# Patient Record
Sex: Female | Born: 1945 | Race: White | Hispanic: No | Marital: Single | Smoking: Current every day smoker
Health system: Southern US, Community
[De-identification: ages and names within clinical notes are randomized; demographics above are authoritative.]

## PROBLEM LIST (undated history)

## (undated) DIAGNOSIS — I10 Essential (primary) hypertension: Secondary | ICD-10-CM

## (undated) DIAGNOSIS — I509 Heart failure, unspecified: Secondary | ICD-10-CM

## (undated) DIAGNOSIS — F0390 Unspecified dementia without behavioral disturbance: Secondary | ICD-10-CM

## (undated) DIAGNOSIS — E119 Type 2 diabetes mellitus without complications: Secondary | ICD-10-CM

## (undated) DIAGNOSIS — E785 Hyperlipidemia, unspecified: Secondary | ICD-10-CM

## (undated) DIAGNOSIS — J449 Chronic obstructive pulmonary disease, unspecified: Secondary | ICD-10-CM

## (undated) HISTORY — PX: CHOLECYSTECTOMY: SHX55

## (undated) HISTORY — PX: ABDOMINAL HYSTERECTOMY: SHX81

---

## 2004-09-02 ENCOUNTER — Emergency Department: Payer: Self-pay | Admitting: Emergency Medicine

## 2005-01-28 ENCOUNTER — Emergency Department: Payer: Self-pay | Admitting: Emergency Medicine

## 2005-01-28 ENCOUNTER — Other Ambulatory Visit: Payer: Self-pay

## 2007-01-27 ENCOUNTER — Other Ambulatory Visit: Payer: Self-pay

## 2007-01-27 ENCOUNTER — Inpatient Hospital Stay: Payer: Self-pay | Admitting: Internal Medicine

## 2007-05-26 ENCOUNTER — Other Ambulatory Visit: Payer: Self-pay

## 2007-05-26 ENCOUNTER — Inpatient Hospital Stay: Payer: Self-pay | Admitting: *Deleted

## 2008-04-20 IMAGING — CR DG CHEST 1V PORT
1 series · 1 of 1 positions shown · non-contrast
Comparison: none

REASON FOR EXAM: Syncope
COMMENTS:

PROCEDURE:     DXR - DXR PORTABLE CHEST SINGLE VIEW  - January 27, 2007  [DATE]
RESULT:     Comparison is made to a prior exam of 01/28/2005. The lung
fields are clear. No acute changes of the heart, mediastinal or osseous
structures are seen. Monitoring electrodes are present.

[view not recorded]
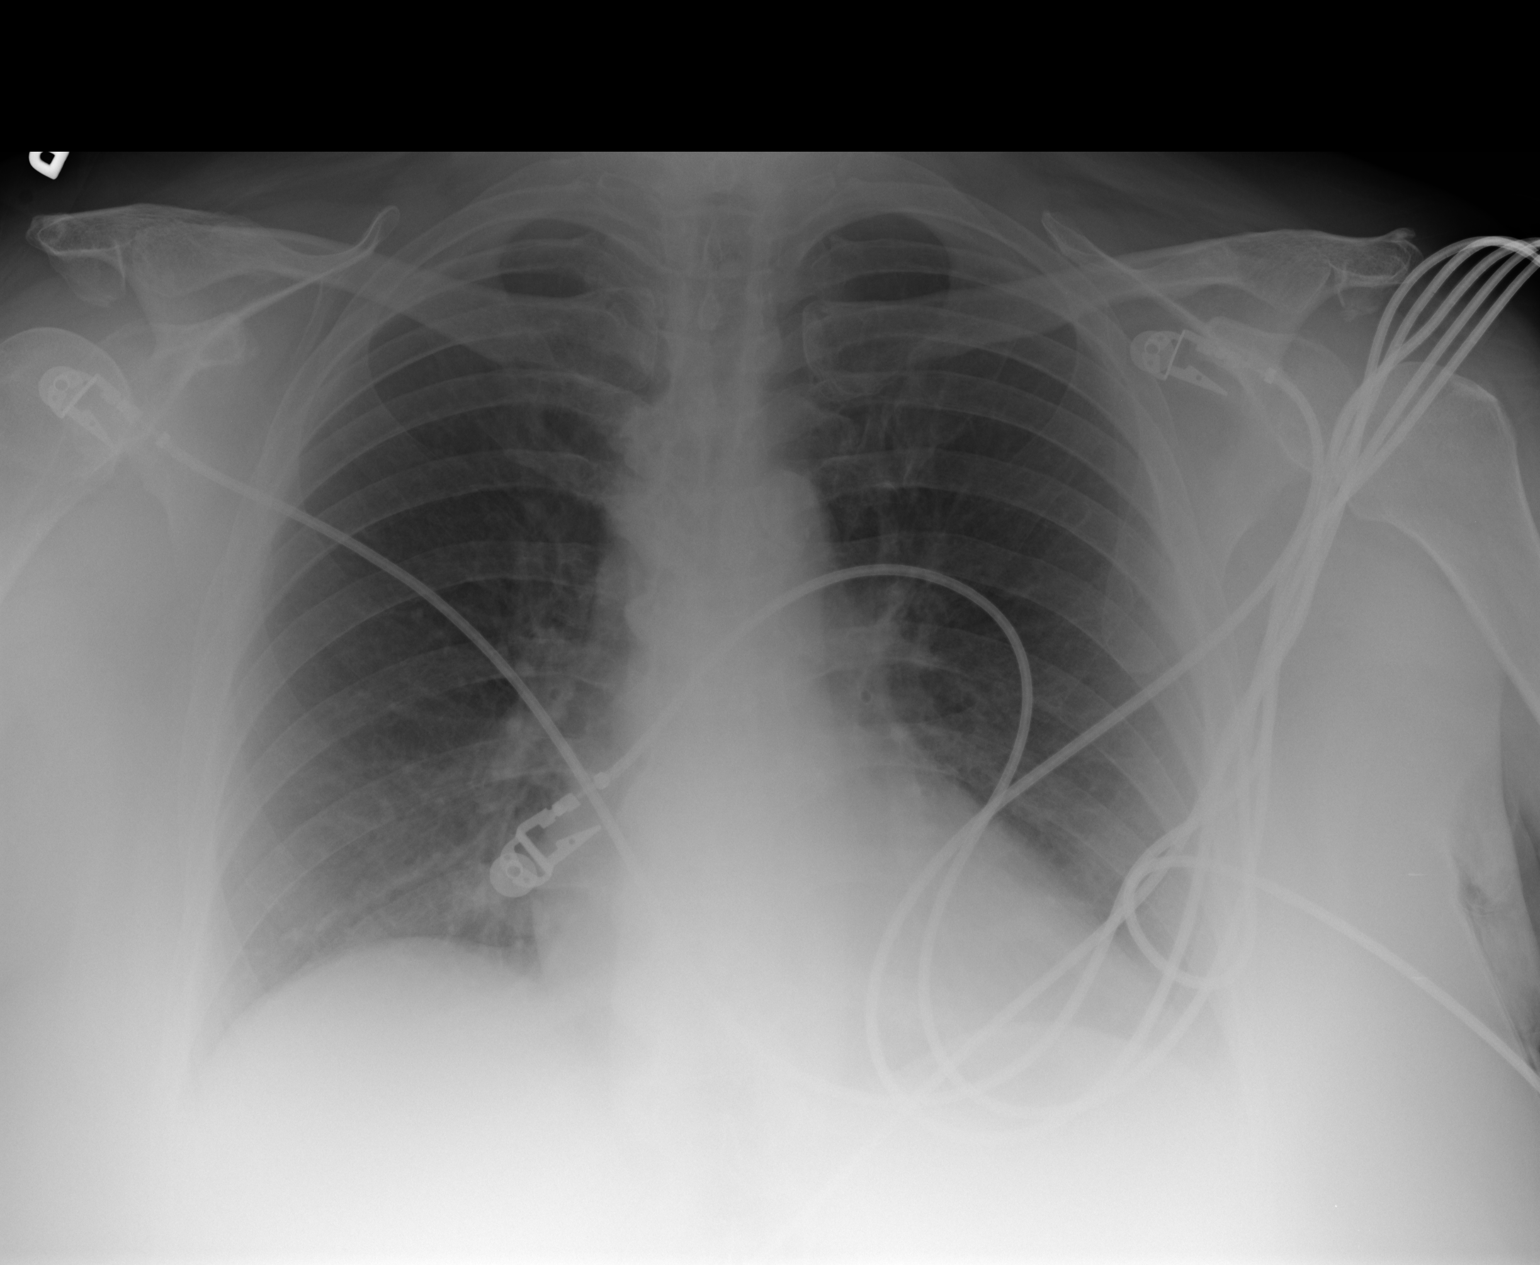

[1 of 1 positions shown; findings below may reference images not displayed]

IMPRESSION: No acute changes are identified.

## 2009-09-16 ENCOUNTER — Emergency Department: Payer: Self-pay | Admitting: Emergency Medicine

## 2010-11-15 ENCOUNTER — Inpatient Hospital Stay: Payer: Self-pay | Admitting: Internal Medicine

## 2013-07-30 ENCOUNTER — Emergency Department: Payer: Self-pay | Admitting: Emergency Medicine

## 2013-07-30 LAB — DRUG SCREEN, URINE

## 2013-07-30 LAB — URINALYSIS, COMPLETE
Bacteria: NONE SEEN
Bilirubin,UR: NEGATIVE
Blood: NEGATIVE
Glucose,UR: NEGATIVE mg/dL (ref 0–75)
Ketone: NEGATIVE
Leukocyte Esterase: NEGATIVE
Nitrite: NEGATIVE
Ph: 6 (ref 4.5–8.0)
Protein: NEGATIVE
RBC,UR: NONE SEEN /HPF (ref 0–5)
Specific Gravity: 1.003 (ref 1.003–1.030)
Squamous Epithelial: 1
WBC UR: <1 /HPF (ref 0–5)

## 2013-07-30 LAB — COMPREHENSIVE METABOLIC PANEL
Albumin: 4 g/dL (ref 3.4–5.0)
Alkaline Phosphatase: 65 U/L (ref 50–136)
Anion Gap: 7 (ref 7–16)
BUN: 21 mg/dL — ABNORMAL HIGH (ref 7–18)
Bilirubin,Total: 0.3 mg/dL (ref 0.2–1.0)
Calcium, Total: 9 mg/dL (ref 8.5–10.1)
Chloride: 103 mmol/L (ref 98–107)
Co2: 21 mmol/L (ref 21–32)
Creatinine: 1.13 mg/dL (ref 0.60–1.30)
EGFR (African American): 59 — ABNORMAL LOW
EGFR (Non-African Amer.): 51 — ABNORMAL LOW
Glucose: 136 mg/dL — ABNORMAL HIGH (ref 65–99)
Osmolality: 268 (ref 275–301)
Potassium: 4.6 mmol/L (ref 3.5–5.1)
SGOT(AST): 14 U/L — ABNORMAL LOW (ref 15–37)
SGPT (ALT): 25 U/L (ref 12–78)
Sodium: 131 mmol/L — ABNORMAL LOW (ref 136–145)
Total Protein: 7.9 g/dL (ref 6.4–8.2)

## 2013-07-30 LAB — CBC
HCT: 40.4 % (ref 35.0–47.0)
HGB: 14.2 g/dL (ref 12.0–16.0)
MCH: 31 pg (ref 26.0–34.0)
MCHC: 35.1 g/dL (ref 32.0–36.0)
MCV: 88 fL (ref 80–100)
Platelet: 230 10*3/uL (ref 150–440)
RBC: 4.59 10*6/uL (ref 3.80–5.20)
RDW: 14.3 % (ref 11.5–14.5)
WBC: 7.6 10*3/uL (ref 3.6–11.0)

## 2013-07-30 LAB — ETHANOL
Ethanol %: <0.003 % (ref 0.000–0.080)
Ethanol: <3 mg/dL

## 2013-07-30 LAB — ACETAMINOPHEN LEVEL: Acetaminophen: <2 ug/mL

## 2013-07-30 LAB — SALICYLATE LEVEL: Salicylates, Serum: 8.3 mg/dL — ABNORMAL HIGH

## 2013-08-02 ENCOUNTER — Emergency Department: Payer: Self-pay | Admitting: Emergency Medicine

## 2013-08-02 LAB — CBC WITH DIFFERENTIAL/PLATELET
Basophil #: 0.1 10*3/uL (ref 0.0–0.1)
Basophil %: 1.1 %
Eosinophil #: 0.2 10*3/uL (ref 0.0–0.7)
Eosinophil %: 1.7 %
HCT: 39.1 % (ref 35.0–47.0)
HGB: 13.7 g/dL (ref 12.0–16.0)
Lymphocyte #: 2.2 10*3/uL (ref 1.0–3.6)
Lymphocyte %: 24.8 %
MCH: 30.6 pg (ref 26.0–34.0)
MCHC: 34.9 g/dL (ref 32.0–36.0)
MCV: 88 fL (ref 80–100)
Monocyte #: 0.4 x10 3/mm (ref 0.2–0.9)
Monocyte %: 4.9 %
Neutrophil #: 6.1 10*3/uL (ref 1.4–6.5)
Neutrophil %: 67.5 %
Platelet: 249 10*3/uL (ref 150–440)
RBC: 4.46 10*6/uL (ref 3.80–5.20)
RDW: 14 % (ref 11.5–14.5)
WBC: 9 10*3/uL (ref 3.6–11.0)

## 2013-08-02 LAB — COMPREHENSIVE METABOLIC PANEL
Albumin: 3.9 g/dL (ref 3.4–5.0)
Alkaline Phosphatase: 48 U/L
Anion Gap: 6 — ABNORMAL LOW (ref 7–16)
BUN: 23 mg/dL — ABNORMAL HIGH (ref 7–18)
Bilirubin,Total: 0.2 mg/dL (ref 0.2–1.0)
Calcium, Total: 8.8 mg/dL (ref 8.5–10.1)
Chloride: 104 mmol/L (ref 98–107)
Co2: 22 mmol/L (ref 21–32)
Creatinine: 1.07 mg/dL (ref 0.60–1.30)
EGFR (African American): >60
EGFR (Non-African Amer.): 54 — ABNORMAL LOW
Glucose: 133 mg/dL — ABNORMAL HIGH (ref 65–99)
Osmolality: 270 (ref 275–301)
Potassium: 4.4 mmol/L (ref 3.5–5.1)
SGOT(AST): 21 U/L (ref 15–37)
SGPT (ALT): 22 U/L (ref 12–78)
Sodium: 132 mmol/L — ABNORMAL LOW (ref 136–145)
Total Protein: 7.5 g/dL (ref 6.4–8.2)

## 2013-08-02 LAB — URINALYSIS, COMPLETE
Bacteria: NONE SEEN
Bilirubin,UR: NEGATIVE
Blood: NEGATIVE
Glucose,UR: NEGATIVE mg/dL (ref 0–75)
Hyaline Cast: 3
Ketone: NEGATIVE
Leukocyte Esterase: NEGATIVE
Nitrite: NEGATIVE
Ph: 5 (ref 4.5–8.0)
Protein: NEGATIVE
RBC,UR: NONE SEEN /HPF (ref 0–5)
Specific Gravity: 1.004 (ref 1.003–1.030)
Squamous Epithelial: <1
WBC UR: <1 /HPF (ref 0–5)

## 2013-08-02 LAB — TROPONIN I: Troponin-I: <0.02 ng/mL

## 2013-08-02 LAB — PRO B NATRIURETIC PEPTIDE: B-Type Natriuretic Peptide: 75 pg/mL (ref 0–125)

## 2013-08-02 LAB — TSH: Thyroid Stimulating Horm: 1.69 u[IU]/mL

## 2013-08-18 ENCOUNTER — Emergency Department: Payer: Self-pay | Admitting: Emergency Medicine

## 2013-08-18 LAB — COMPREHENSIVE METABOLIC PANEL
Albumin: 4.1 g/dL (ref 3.4–5.0)
Alkaline Phosphatase: 50 U/L
Anion Gap: 8 (ref 7–16)
BUN: 32 mg/dL — ABNORMAL HIGH (ref 7–18)
Bilirubin,Total: 0.4 mg/dL (ref 0.2–1.0)
Calcium, Total: 8.8 mg/dL (ref 8.5–10.1)
Chloride: 101 mmol/L (ref 98–107)
Co2: 18 mmol/L — ABNORMAL LOW (ref 21–32)
Creatinine: 1.44 mg/dL — ABNORMAL HIGH (ref 0.60–1.30)
EGFR (African American): 44 — ABNORMAL LOW
EGFR (Non-African Amer.): 38 — ABNORMAL LOW
Glucose: 132 mg/dL — ABNORMAL HIGH (ref 65–99)
Osmolality: 264 (ref 275–301)
Potassium: 4.4 mmol/L (ref 3.5–5.1)
SGOT(AST): 22 U/L (ref 15–37)
SGPT (ALT): 25 U/L (ref 12–78)
Sodium: 127 mmol/L — ABNORMAL LOW (ref 136–145)
Total Protein: 7.8 g/dL (ref 6.4–8.2)

## 2013-08-18 LAB — URINALYSIS, COMPLETE
Bacteria: NONE SEEN
Bilirubin,UR: NEGATIVE
Blood: NEGATIVE
Glucose,UR: NEGATIVE mg/dL (ref 0–75)
Ketone: NEGATIVE
Leukocyte Esterase: NEGATIVE
Nitrite: NEGATIVE
Ph: 5 (ref 4.5–8.0)
Protein: NEGATIVE
RBC,UR: NONE SEEN /HPF (ref 0–5)
Specific Gravity: 1.01 (ref 1.003–1.030)
Squamous Epithelial: 4
WBC UR: <1 /HPF (ref 0–5)

## 2013-08-18 LAB — DRUG SCREEN, URINE

## 2013-08-18 LAB — ACETAMINOPHEN LEVEL: Acetaminophen: <2 ug/mL

## 2013-08-18 LAB — CBC
HCT: 41.4 % (ref 35.0–47.0)
HGB: 14.4 g/dL (ref 12.0–16.0)
MCH: 30.9 pg (ref 26.0–34.0)
MCHC: 34.9 g/dL (ref 32.0–36.0)
MCV: 89 fL (ref 80–100)
Platelet: 206 10*3/uL (ref 150–440)
RBC: 4.68 10*6/uL (ref 3.80–5.20)
RDW: 14.2 % (ref 11.5–14.5)
WBC: 9.2 10*3/uL (ref 3.6–11.0)

## 2013-08-18 LAB — ETHANOL
Ethanol %: <0.003 % (ref 0.000–0.080)
Ethanol: <3 mg/dL

## 2013-08-18 LAB — SALICYLATE LEVEL: Salicylates, Serum: 10.1 mg/dL — ABNORMAL HIGH

## 2013-09-29 ENCOUNTER — Emergency Department: Payer: Self-pay | Admitting: Emergency Medicine

## 2013-09-29 LAB — COMPREHENSIVE METABOLIC PANEL
Albumin: 3.4 g/dL (ref 3.4–5.0)
Alkaline Phosphatase: 56 U/L
Anion Gap: 8 (ref 7–16)
BUN: 12 mg/dL (ref 7–18)
Bilirubin,Total: 0.2 mg/dL (ref 0.2–1.0)
Calcium, Total: 8.5 mg/dL (ref 8.5–10.1)
Chloride: 105 mmol/L (ref 98–107)
Co2: 23 mmol/L (ref 21–32)
Creatinine: 0.89 mg/dL (ref 0.60–1.30)
EGFR (African American): >60
EGFR (Non-African Amer.): >60
Glucose: 147 mg/dL — ABNORMAL HIGH (ref 65–99)
Osmolality: 274 (ref 275–301)
Potassium: 3.8 mmol/L (ref 3.5–5.1)
SGOT(AST): 20 U/L (ref 15–37)
SGPT (ALT): 23 U/L (ref 12–78)
Sodium: 136 mmol/L (ref 136–145)
Total Protein: 6.8 g/dL (ref 6.4–8.2)

## 2013-09-29 LAB — PRO B NATRIURETIC PEPTIDE: B-Type Natriuretic Peptide: 135 pg/mL — ABNORMAL HIGH (ref 0–125)

## 2013-09-29 LAB — CK TOTAL AND CKMB (NOT AT ARMC)
CK, Total: 69 U/L (ref 21–215)
CK-MB: 1.9 ng/mL (ref 0.5–3.6)

## 2013-09-29 LAB — CBC
HCT: 36.2 % (ref 35.0–47.0)
HGB: 12.5 g/dL (ref 12.0–16.0)
MCH: 31 pg (ref 26.0–34.0)
MCHC: 34.5 g/dL (ref 32.0–36.0)
MCV: 90 fL (ref 80–100)
Platelet: 243 10*3/uL (ref 150–440)
RBC: 4.03 10*6/uL (ref 3.80–5.20)
RDW: 14.7 % — ABNORMAL HIGH (ref 11.5–14.5)
WBC: 9.1 10*3/uL (ref 3.6–11.0)

## 2013-09-29 LAB — TROPONIN I: Troponin-I: <0.02 ng/mL

## 2013-10-09 LAB — CBC
HCT: 39.6 % (ref 35.0–47.0)
HGB: 13.7 g/dL (ref 12.0–16.0)
MCH: 30.8 pg (ref 26.0–34.0)
MCHC: 34.5 g/dL (ref 32.0–36.0)
MCV: 89 fL (ref 80–100)
Platelet: 239 10*3/uL (ref 150–440)
RBC: 4.44 10*6/uL (ref 3.80–5.20)
RDW: 14.2 % (ref 11.5–14.5)
WBC: 10.5 10*3/uL (ref 3.6–11.0)

## 2013-10-09 LAB — BASIC METABOLIC PANEL
Anion Gap: 9 (ref 7–16)
BUN: 14 mg/dL (ref 7–18)
Calcium, Total: 9 mg/dL (ref 8.5–10.1)
Chloride: 95 mmol/L — ABNORMAL LOW (ref 98–107)
Co2: 23 mmol/L (ref 21–32)
Creatinine: 1.29 mg/dL (ref 0.60–1.30)
EGFR (African American): 50 — ABNORMAL LOW
EGFR (Non-African Amer.): 43 — ABNORMAL LOW
Glucose: 144 mg/dL — ABNORMAL HIGH (ref 65–99)
Osmolality: 258 (ref 275–301)
Potassium: 4.3 mmol/L (ref 3.5–5.1)
Sodium: 127 mmol/L — ABNORMAL LOW (ref 136–145)

## 2013-10-09 LAB — TROPONIN I: Troponin-I: <0.02 ng/mL

## 2013-10-10 DIAGNOSIS — I369 Nonrheumatic tricuspid valve disorder, unspecified: Secondary | ICD-10-CM

## 2013-10-10 LAB — BASIC METABOLIC PANEL
Anion Gap: 8 (ref 7–16)
BUN: 17 mg/dL (ref 7–18)
Calcium, Total: 8.3 mg/dL — ABNORMAL LOW (ref 8.5–10.1)
Chloride: 98 mmol/L (ref 98–107)
Co2: 23 mmol/L (ref 21–32)
Creatinine: 1.41 mg/dL — ABNORMAL HIGH (ref 0.60–1.30)
EGFR (African American): 45 — ABNORMAL LOW
EGFR (Non-African Amer.): 39 — ABNORMAL LOW
Glucose: 129 mg/dL — ABNORMAL HIGH (ref 65–99)
Osmolality: 262 (ref 275–301)
Potassium: 3.9 mmol/L (ref 3.5–5.1)
Sodium: 129 mmol/L — ABNORMAL LOW (ref 136–145)

## 2013-10-10 LAB — URINALYSIS, COMPLETE
Bilirubin,UR: NEGATIVE
Blood: NEGATIVE
Glucose,UR: NEGATIVE mg/dL (ref 0–75)
Granular Cast: 4
Hyaline Cast: 9
Ketone: NEGATIVE
Nitrite: NEGATIVE
Ph: 5 (ref 4.5–8.0)
Protein: NEGATIVE
RBC,UR: 1 /HPF (ref 0–5)
Specific Gravity: 1.013 (ref 1.003–1.030)
Squamous Epithelial: 5
WBC UR: 4 /HPF (ref 0–5)

## 2013-10-10 LAB — TROPONIN I
Troponin-I: <0.02 ng/mL
Troponin-I: <0.02 ng/mL

## 2013-10-10 LAB — CK-MB
CK-MB: 2.8 ng/mL (ref 0.5–3.6)
CK-MB: 3 ng/mL (ref 0.5–3.6)
CK-MB: 3.1 ng/mL (ref 0.5–3.6)

## 2013-10-10 LAB — LIPID PANEL
Cholesterol: 118 mg/dL (ref 0–200)
HDL Cholesterol: 36 mg/dL — ABNORMAL LOW (ref 40–60)
Ldl Cholesterol, Calc: 47 mg/dL (ref 0–100)
Triglycerides: 173 mg/dL (ref 0–200)
VLDL Cholesterol, Calc: 35 mg/dL (ref 5–40)

## 2013-10-11 ENCOUNTER — Inpatient Hospital Stay: Payer: Self-pay | Admitting: Internal Medicine

## 2013-10-11 LAB — BASIC METABOLIC PANEL
Anion Gap: 4 — ABNORMAL LOW (ref 7–16)
BUN: 18 mg/dL (ref 7–18)
Calcium, Total: 9.3 mg/dL (ref 8.5–10.1)
Chloride: 93 mmol/L — ABNORMAL LOW (ref 98–107)
Co2: 25 mmol/L (ref 21–32)
Creatinine: 0.99 mg/dL (ref 0.60–1.30)
EGFR (African American): >60
EGFR (Non-African Amer.): 59 — ABNORMAL LOW
Glucose: 162 mg/dL — ABNORMAL HIGH (ref 65–99)
Osmolality: 251 (ref 275–301)
Potassium: 4.4 mmol/L (ref 3.5–5.1)
Sodium: 122 mmol/L — ABNORMAL LOW (ref 136–145)

## 2013-10-12 ENCOUNTER — Ambulatory Visit: Payer: Self-pay | Admitting: Oncology

## 2013-10-12 LAB — BASIC METABOLIC PANEL
Anion Gap: 6 — ABNORMAL LOW (ref 7–16)
BUN: 20 mg/dL — ABNORMAL HIGH (ref 7–18)
Calcium, Total: 9.4 mg/dL (ref 8.5–10.1)
Chloride: 95 mmol/L — ABNORMAL LOW (ref 98–107)
Co2: 22 mmol/L (ref 21–32)
Creatinine: 0.94 mg/dL (ref 0.60–1.30)
EGFR (African American): >60
EGFR (Non-African Amer.): >60
Glucose: 154 mg/dL — ABNORMAL HIGH (ref 65–99)
Osmolality: 253 (ref 275–301)
Potassium: 4.3 mmol/L (ref 3.5–5.1)
Sodium: 123 mmol/L — ABNORMAL LOW (ref 136–145)

## 2013-10-12 LAB — URIC ACID: Uric Acid: 3.5 mg/dL (ref 2.6–6.0)

## 2013-10-12 LAB — CHLORIDE, URINE, RANDOM: Chloride, Urine Random: 13 mmol/L — ABNORMAL LOW (ref 55–125)

## 2013-10-12 LAB — OSMOLALITY: Osmolality: 133 mOsm/kg — CL (ref 280–301)

## 2013-10-12 LAB — OSMOLALITY, URINE: Osmolality: 133 mOsm/kg

## 2013-10-12 LAB — POTASSIUM, URINE RANDOM: Potassium, Urine Random: 6 mmol/L — ABNORMAL LOW (ref 55–125)

## 2013-10-12 LAB — SODIUM, URINE, RANDOM: Sodium, Urine Random: 22 mmol/L (ref 20–110)

## 2013-10-13 LAB — BASIC METABOLIC PANEL
Anion Gap: 4 — ABNORMAL LOW (ref 7–16)
BUN: 24 mg/dL — ABNORMAL HIGH (ref 7–18)
Calcium, Total: 9.4 mg/dL (ref 8.5–10.1)
Chloride: 102 mmol/L (ref 98–107)
Co2: 25 mmol/L (ref 21–32)
Creatinine: 1.05 mg/dL (ref 0.60–1.30)
EGFR (African American): >60
EGFR (Non-African Amer.): 55 — ABNORMAL LOW
Glucose: 126 mg/dL — ABNORMAL HIGH (ref 65–99)
Osmolality: 268 (ref 275–301)
Potassium: 4.3 mmol/L (ref 3.5–5.1)
Sodium: 131 mmol/L — ABNORMAL LOW (ref 136–145)

## 2013-10-13 LAB — PLATELET COUNT: Platelet: 242 10*3/uL (ref 150–440)

## 2013-11-07 ENCOUNTER — Emergency Department: Payer: Self-pay | Admitting: Emergency Medicine

## 2013-11-07 LAB — CBC
HCT: 41 % (ref 35.0–47.0)
HGB: 13.4 g/dL (ref 12.0–16.0)
MCH: 29.8 pg (ref 26.0–34.0)
MCHC: 32.7 g/dL (ref 32.0–36.0)
MCV: 91 fL (ref 80–100)
Platelet: 312 10*3/uL (ref 150–440)
RBC: 4.5 10*6/uL (ref 3.80–5.20)
RDW: 14 % (ref 11.5–14.5)
WBC: 11.8 10*3/uL — ABNORMAL HIGH (ref 3.6–11.0)

## 2013-11-07 LAB — COMPREHENSIVE METABOLIC PANEL
Albumin: 3.9 g/dL (ref 3.4–5.0)
Alkaline Phosphatase: 60 U/L
Anion Gap: 7 (ref 7–16)
BUN: 12 mg/dL (ref 7–18)
Bilirubin,Total: 0.3 mg/dL (ref 0.2–1.0)
Calcium, Total: 9 mg/dL (ref 8.5–10.1)
Chloride: 106 mmol/L (ref 98–107)
Co2: 24 mmol/L (ref 21–32)
Creatinine: 1.21 mg/dL (ref 0.60–1.30)
EGFR (African American): 54 — ABNORMAL LOW
EGFR (Non-African Amer.): 47 — ABNORMAL LOW
Glucose: 136 mg/dL — ABNORMAL HIGH (ref 65–99)
Osmolality: 276 (ref 275–301)
Potassium: 3.9 mmol/L (ref 3.5–5.1)
SGOT(AST): 14 U/L — ABNORMAL LOW (ref 15–37)
SGPT (ALT): 31 U/L (ref 12–78)
Sodium: 137 mmol/L (ref 136–145)
Total Protein: 7.7 g/dL (ref 6.4–8.2)

## 2013-11-07 LAB — ETHANOL
Ethanol %: <0.003 % (ref 0.000–0.080)
Ethanol: <3 mg/dL

## 2013-11-07 LAB — DRUG SCREEN, URINE
Amphetamines, Ur Screen: NEGATIVE (ref ?–1000)
Barbiturates, Ur Screen: NEGATIVE (ref ?–200)
Benzodiazepine, Ur Scrn: NEGATIVE (ref ?–200)
Cannabinoid 50 Ng, Ur ~~LOC~~: NEGATIVE (ref ?–50)
Cocaine Metabolite,Ur ~~LOC~~: NEGATIVE (ref ?–300)
MDMA (Ecstasy)Ur Screen: POSITIVE (ref ?–500)
Methadone, Ur Screen: NEGATIVE (ref ?–300)
Opiate, Ur Screen: NEGATIVE (ref ?–300)
Phencyclidine (PCP) Ur S: NEGATIVE (ref ?–25)
Tricyclic, Ur Screen: NEGATIVE (ref ?–1000)

## 2013-11-07 LAB — ACETAMINOPHEN LEVEL: Acetaminophen: <2 ug/mL

## 2013-11-07 LAB — URINALYSIS, COMPLETE
Bacteria: NONE SEEN
Bilirubin,UR: NEGATIVE
Blood: NEGATIVE
Glucose,UR: NEGATIVE mg/dL (ref 0–75)
Hyaline Cast: 4
Ketone: NEGATIVE
Leukocyte Esterase: NEGATIVE
Nitrite: NEGATIVE
Ph: 5 (ref 4.5–8.0)
Protein: 30
RBC,UR: <1 /HPF (ref 0–5)
Specific Gravity: 1.011 (ref 1.003–1.030)
Squamous Epithelial: 1
WBC UR: 1 /HPF (ref 0–5)

## 2013-11-07 LAB — SALICYLATE LEVEL: Salicylates, Serum: 4.8 mg/dL — ABNORMAL HIGH

## 2013-11-08 ENCOUNTER — Ambulatory Visit: Payer: Self-pay | Admitting: Oncology

## 2014-01-05 ENCOUNTER — Emergency Department: Payer: Self-pay | Admitting: Emergency Medicine

## 2014-01-05 LAB — URINALYSIS, COMPLETE
Bilirubin,UR: NEGATIVE
Glucose,UR: NEGATIVE mg/dL (ref 0–75)
Ketone: NEGATIVE
Nitrite: NEGATIVE
Ph: 6 (ref 4.5–8.0)
Protein: NEGATIVE
RBC,UR: 1 /HPF (ref 0–5)
Specific Gravity: 1.003 (ref 1.003–1.030)
Squamous Epithelial: 1
WBC UR: 12 /HPF (ref 0–5)

## 2014-01-05 LAB — COMPREHENSIVE METABOLIC PANEL
Albumin: 4 g/dL (ref 3.4–5.0)
Alkaline Phosphatase: 62 U/L
Anion Gap: 8 (ref 7–16)
BUN: 9 mg/dL (ref 7–18)
Bilirubin,Total: 0.3 mg/dL (ref 0.2–1.0)
Calcium, Total: 9.3 mg/dL (ref 8.5–10.1)
Chloride: 106 mmol/L (ref 98–107)
Co2: 24 mmol/L (ref 21–32)
Creatinine: 0.86 mg/dL (ref 0.60–1.30)
EGFR (African American): >60
EGFR (Non-African Amer.): >60
Glucose: 135 mg/dL — ABNORMAL HIGH (ref 65–99)
Osmolality: 276 (ref 275–301)
Potassium: 4 mmol/L (ref 3.5–5.1)
SGOT(AST): 19 U/L (ref 15–37)
SGPT (ALT): 19 U/L (ref 12–78)
Sodium: 138 mmol/L (ref 136–145)
Total Protein: 8.1 g/dL (ref 6.4–8.2)

## 2014-01-05 LAB — CBC
HCT: 45.3 % (ref 35.0–47.0)
HGB: 15.2 g/dL (ref 12.0–16.0)
MCH: 29.5 pg (ref 26.0–34.0)
MCHC: 33.5 g/dL (ref 32.0–36.0)
MCV: 88 fL (ref 80–100)
Platelet: 251 10*3/uL (ref 150–440)
RBC: 5.15 10*6/uL (ref 3.80–5.20)
RDW: 14.2 % (ref 11.5–14.5)
WBC: 12.2 10*3/uL — ABNORMAL HIGH (ref 3.6–11.0)

## 2014-01-05 LAB — DRUG SCREEN, URINE

## 2014-01-05 LAB — ACETAMINOPHEN LEVEL: Acetaminophen: <2 ug/mL

## 2014-01-05 LAB — SALICYLATE LEVEL: Salicylates, Serum: 6.1 mg/dL — ABNORMAL HIGH

## 2014-01-05 LAB — ETHANOL
Ethanol %: <0.003 % (ref 0.000–0.080)
Ethanol: <3 mg/dL

## 2014-01-07 LAB — URINE CULTURE

## 2014-05-28 ENCOUNTER — Emergency Department: Payer: Self-pay | Admitting: Emergency Medicine

## 2014-05-28 LAB — CBC
HCT: 40.2 % (ref 35.0–47.0)
HGB: 12.5 g/dL (ref 12.0–16.0)
MCH: 24.9 pg — ABNORMAL LOW (ref 26.0–34.0)
MCHC: 31.1 g/dL — ABNORMAL LOW (ref 32.0–36.0)
MCV: 80 fL (ref 80–100)
Platelet: 209 10*3/uL (ref 150–440)
RBC: 5.02 10*6/uL (ref 3.80–5.20)
RDW: 17.2 % — ABNORMAL HIGH (ref 11.5–14.5)
WBC: 6.7 10*3/uL (ref 3.6–11.0)

## 2014-05-28 LAB — BASIC METABOLIC PANEL
Anion Gap: 5 — ABNORMAL LOW (ref 7–16)
BUN: 11 mg/dL (ref 7–18)
Calcium, Total: 8.9 mg/dL (ref 8.5–10.1)
Chloride: 104 mmol/L (ref 98–107)
Co2: 28 mmol/L (ref 21–32)
Creatinine: 1.01 mg/dL (ref 0.60–1.30)
EGFR (African American): >60
EGFR (Non-African Amer.): 58 — ABNORMAL LOW
Glucose: 106 mg/dL — ABNORMAL HIGH (ref 65–99)
Osmolality: 274 (ref 275–301)
Potassium: 3.7 mmol/L (ref 3.5–5.1)
Sodium: 137 mmol/L (ref 136–145)

## 2014-05-28 LAB — CK TOTAL AND CKMB (NOT AT ARMC)
CK, Total: 69 U/L
CK-MB: 2.4 ng/mL (ref 0.5–3.6)

## 2014-05-28 LAB — TROPONIN I: Troponin-I: <0.02 ng/mL

## 2014-05-28 LAB — PRO B NATRIURETIC PEPTIDE: B-Type Natriuretic Peptide: 1349 pg/mL — ABNORMAL HIGH (ref 0–125)

## 2014-07-19 ENCOUNTER — Emergency Department: Payer: Self-pay | Admitting: Internal Medicine

## 2014-10-25 IMAGING — CR DG CHEST 1V PORT
1 series · 1 of 1 positions shown · non-contrast
Comparison: 11/15/2010

CLINICAL DATA: History of pneumonia

EXAM:
PORTABLE CHEST - 1 VIEW

[x chest ap]
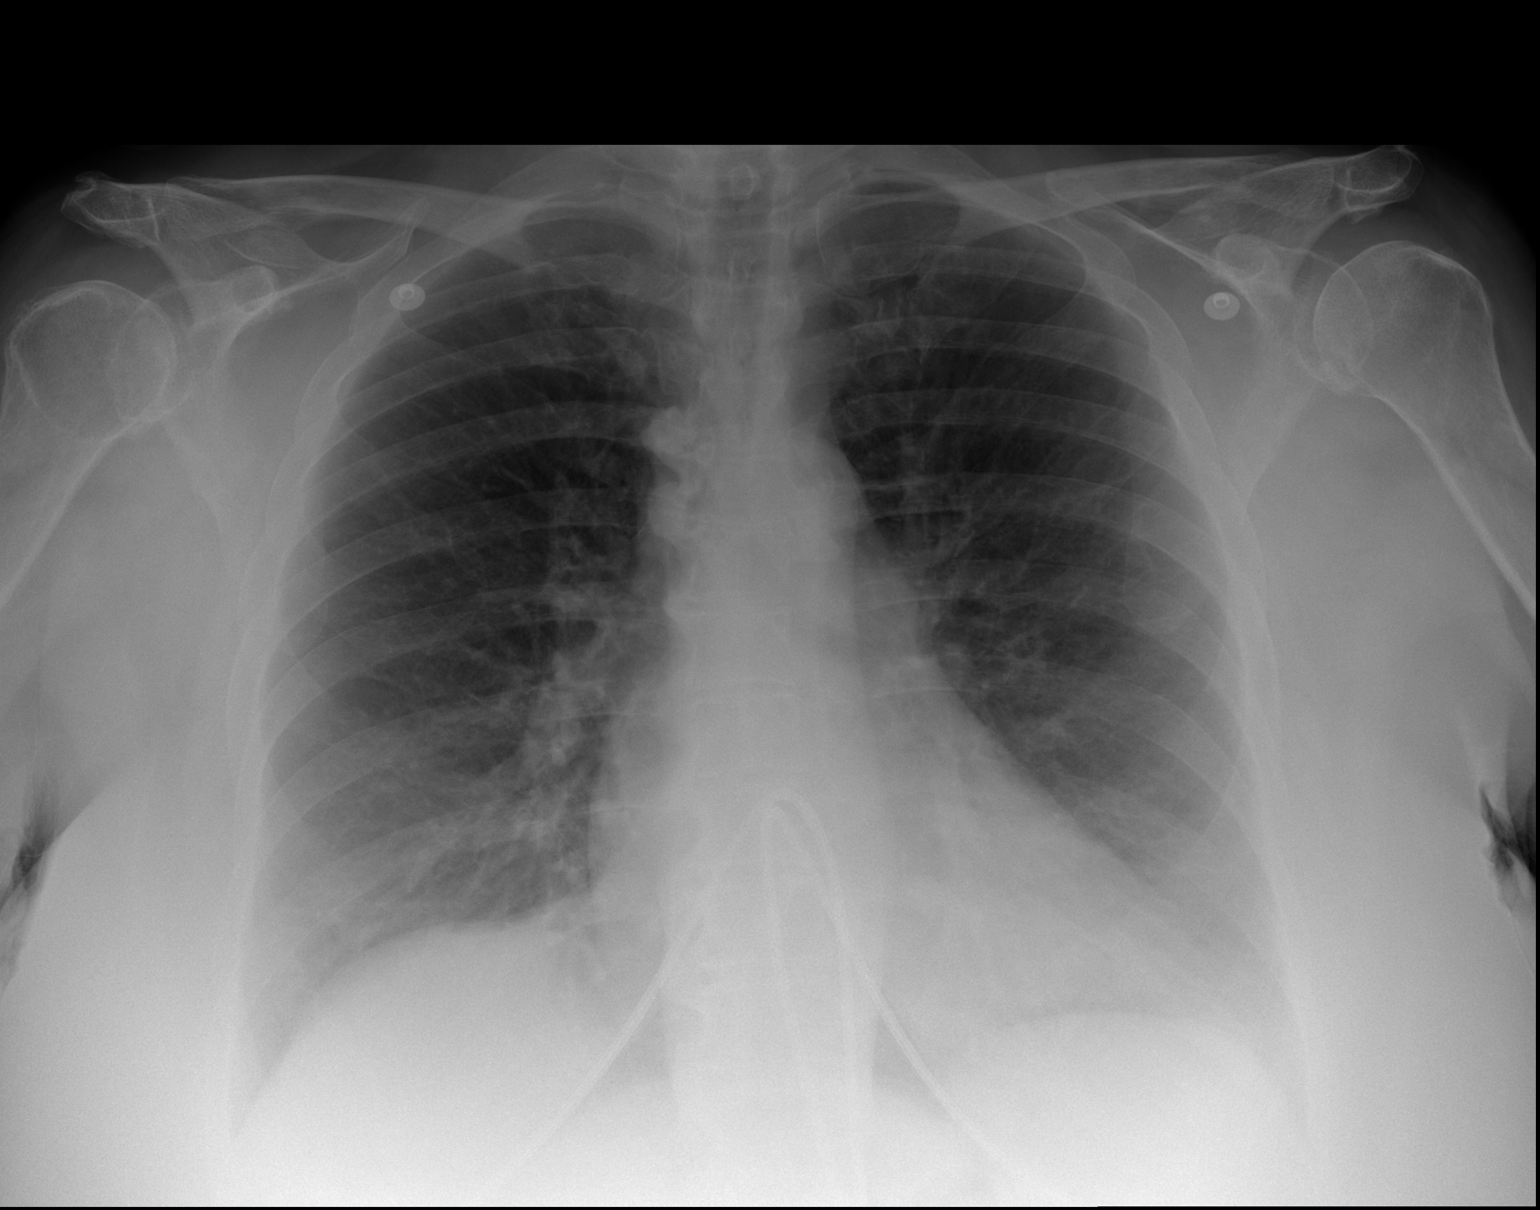

[1 of 1 positions shown; findings below may reference images not displayed]

FINDINGS: There is mild prominence of interstitial markings. No focal regions
of consolidation or focal infiltrates. Cardiac silhouette is
mild-to-moderately enlarged. The osseous structures are
unremarkable.
IMPRESSION: 1. Pulmonary vascular congestion
2. No evidence of focal regions of consolidation or focal
infiltrates.

## 2014-12-22 IMAGING — CR DG CHEST 2V
1 series · 3 of 3 positions shown · non-contrast
Comparison: Chest radiograph 08/02/2013 and 11/15/2010

CLINICAL DATA: Shortness of breath for 4 months.  COPD.

EXAM:
CHEST  2 VIEW

[Series 1: x chest ap · 0.14mm/px · 3 of 3 slices shown]
[im 1/3]
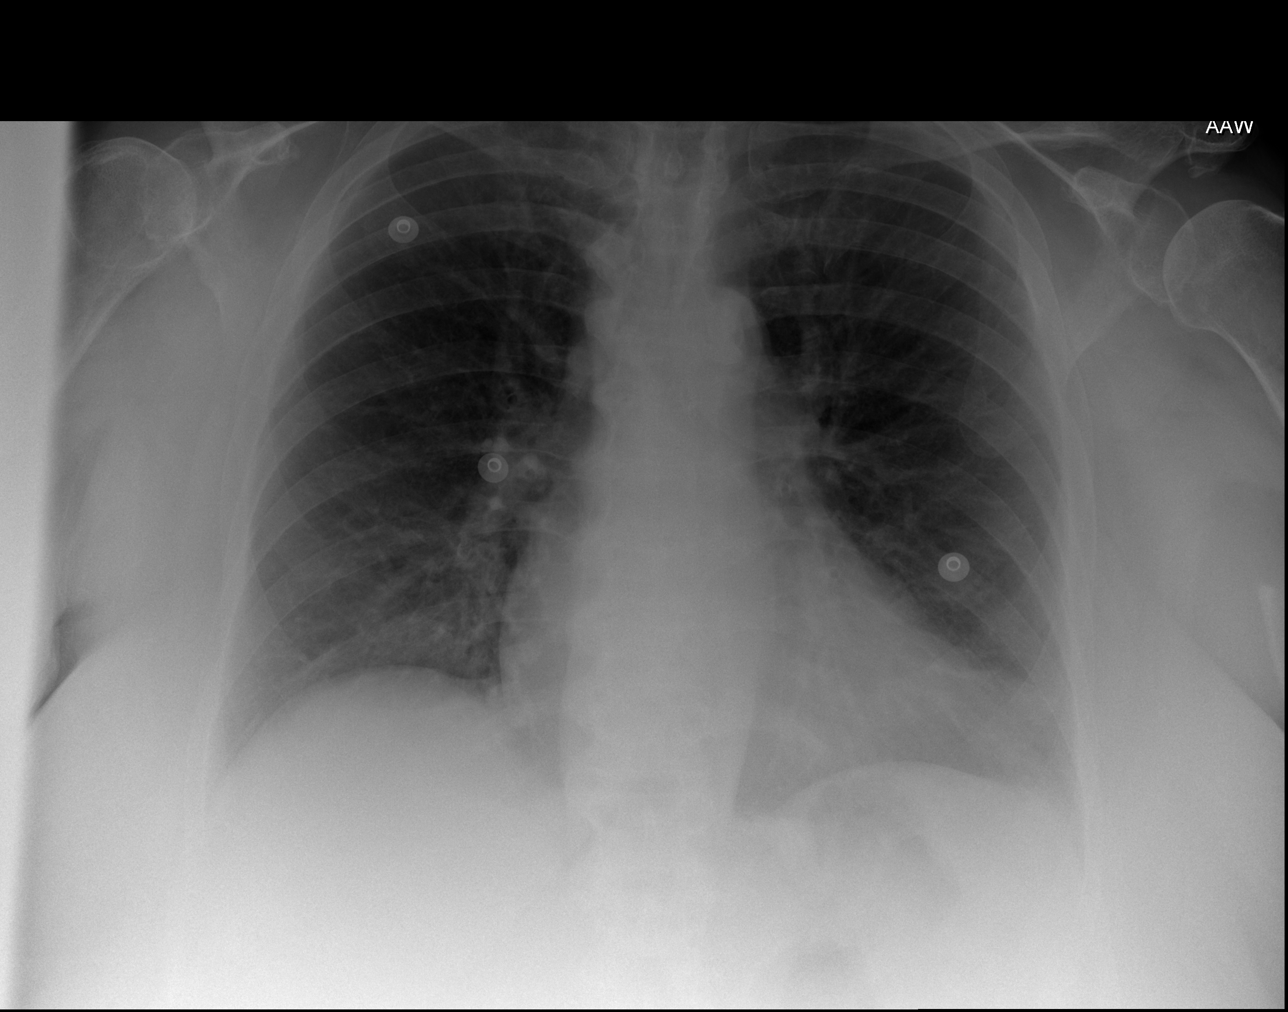
[im 2/3]
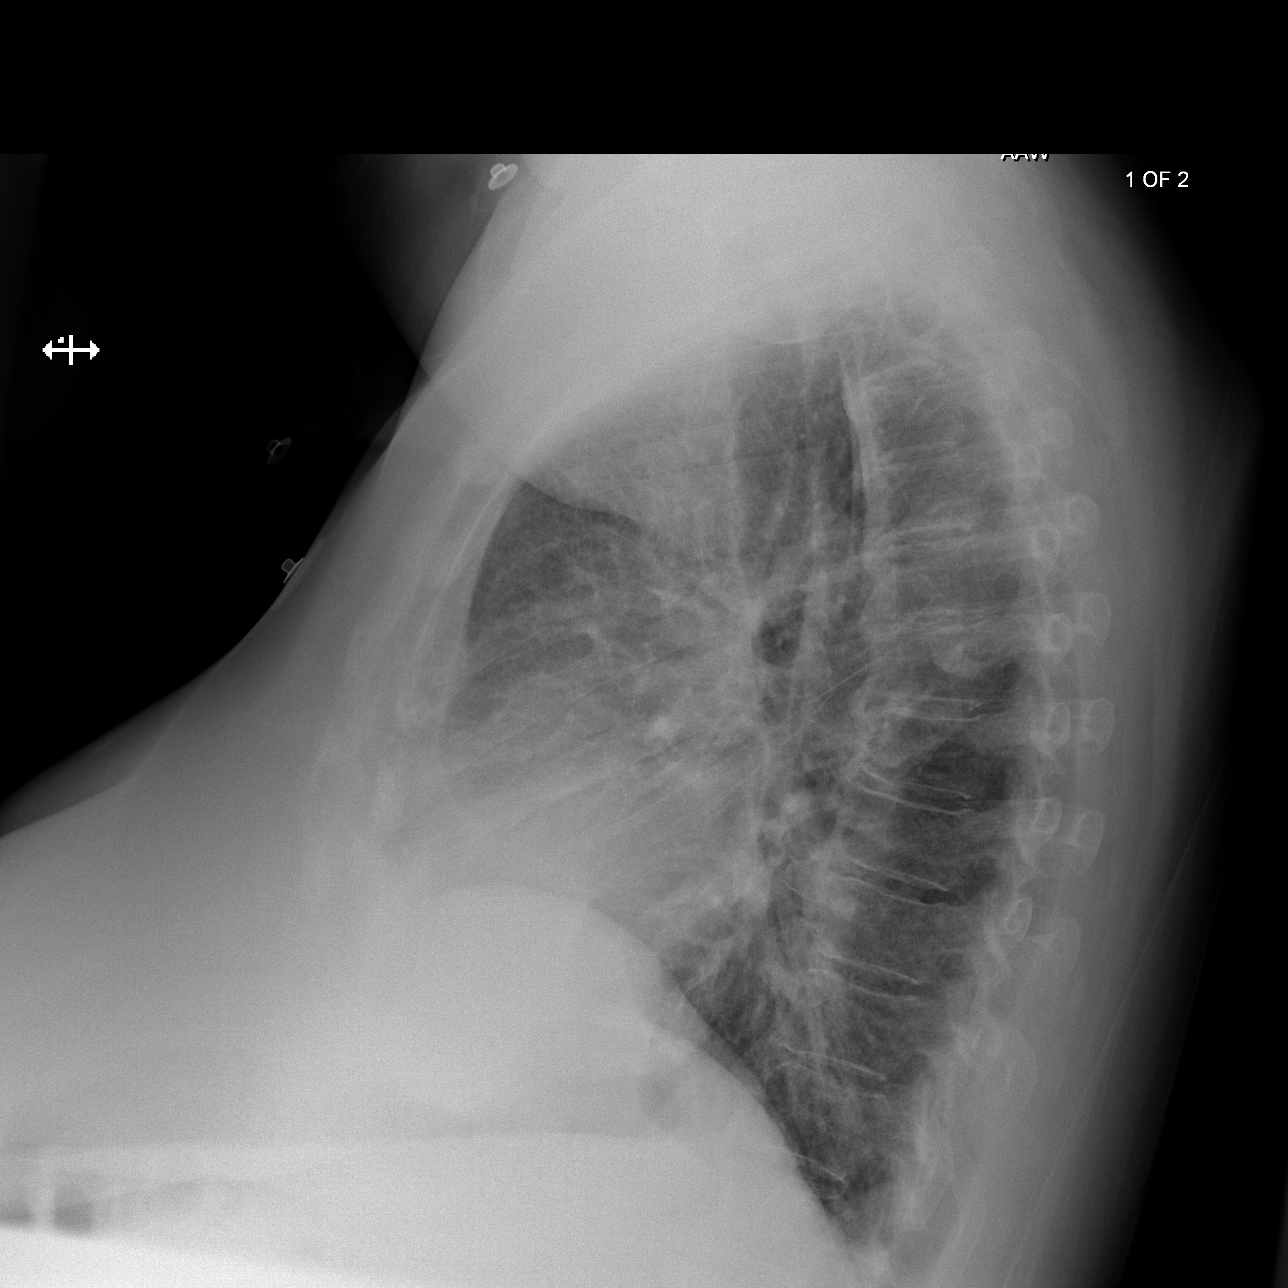
[im 3/3]
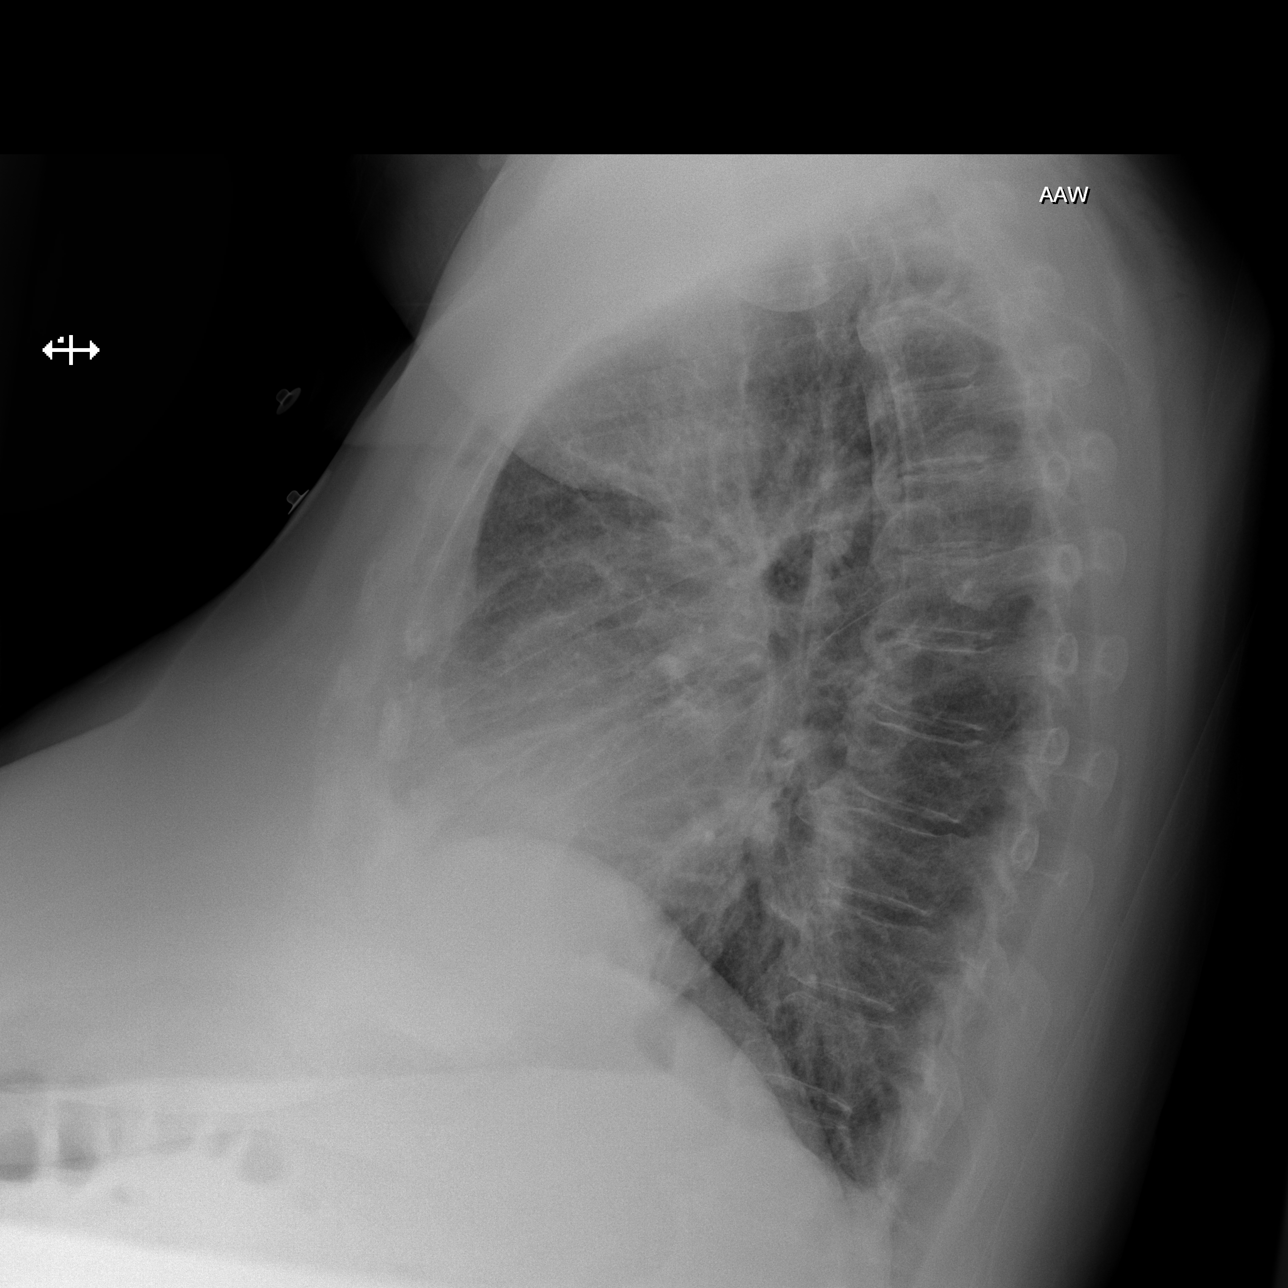

[3 of 3 positions shown; findings below may reference images not displayed]

FINDINGS: Cardiac silhouette is mildly enlarged and stable. Mediastinal and
hilar contours are normal. Pulmonary vascularity appears mildly
prominent. The lungs are normally expanded and clear. Negative for
pleural effusion or pneumothorax. No acute osseous abnormality.
Degenerative changes of the thoracic spine are noted.
IMPRESSION: Mild cardiomegaly and mild pulmonary vascular congestion.

## 2014-12-31 NOTE — Consult Note (Signed)
PATIENT NAME:  Dominique Burgess, Dominique Burgess MR#:  [id number] DATE OF BIRTH:  [birth year]  DATE OF CONSULTATION:  08/18/2013  REFERRING PHYSICIAN:  Cecille AmsterdamJonathan E. Mayford KnifeWilliams, MD CONSULTING PHYSICIAN:  Ardeen FillersUzma S. Garnetta BuddyFaheem, MD  REASON FOR CONSULTATION: I'm still hearing voices.   HISTORY OF PRESENT ILLNESS: The patient is a 69 year old white female who presented to the ER accompanied by her daughter. The patient was [facility], where she was kept in the ER for 2 days and was discharged on November 26. According to the history obtained from the patient's daughter, Ileene RubensDarla Simpson, the patient has been experiencing auditory hallucinations for the past 9 years, and they have been progressively increasing for the past year. Darla took mother to [facility] for psychiatric followup, but they told that she needed to show the power of attorney paperwork before the [facility] can be [facility]. Then, she [facility] the [facility] and was only kept in the ER. They started her on Zyprexa and trazodone. The patient did not experience any significant improvement on her medications. The patient continues to have auditory hallucinations which are chronic. Sometimes they are commanding in nature. The patient also sleeps more on the trazodone.   During my interview, the patient appeared calm, and she reported that she stays at home most of the time and sits in the chair. She reported that she is not having any auditory hallucinations at this time. She occasionally sees a shadow of the ghost. The patient reported that she does a good job, and [facility] [place] [facility] [place] [facility]. The patient stated that she is resting better with the new medication because it helps her sleep. She currently denied having any suicidal or homicidal ideations or plans.   PAST PSYCHIATRIC HISTORY: The patient has been diagnosed with dementia in the past and has been getting medications for the same. She has been taking olanzapine 2.5 mg  at bedtime and trazodone 100 mg half-tablet at bedtime to help her sleep.   MEDICAL CONDITIONS: Asthma, hypertension, diabetes. GERD.   CURRENT MEDICATIONS:  1. Symbicort 160/4.5 mcg inhaler.  2. Atrovent inhaler.  3. ProAir inhaler. 4. Lasix 40 mg daily.  5. Lisinopril 20 mg daily.  6. Trazodone 100 mg 0.5 tablet at bedtime.  7. Metformin 500 mg 2 tablets twice a day. 8. Omeprazole 20 mg daily.  9. Potassium chloride 10 mEq twice a day.  10. Olanzapine 2.5 mg once a day.  11. Pravastatin 40 mg daily.   SOCIAL HISTORY: The patient currently lives with her daughter.  ALLERGIES: PENICILLIN CAUSING RASH.   REVIEW OF SYSTEMS:  CONSTITUTIONAL: Denies any fever or chills. No weight changes.  EYES: No double or blurred vision.  RESPIRATORY: No shortness of breath or cough.  CARDIOVASCULAR: No chest pain or orthopnea.  GASTROINTESTINAL: No abdominal pain, nausea, vomiting or diarrhea.  GENITOURINARY: No incontinence or frequency.  ENDOCRINE: No heat or cold intolerance.  LYMPHATIC: No anemia or easy bruising.  INTEGUMENTARY: No acne or rash.  MUSCULOSKELETAL: No muscle or joint pain.   VITAL SIGNS: Temperature 98.7, pulse 113, respirations 18, blood pressure 138/82.   LABORATORY DATA: Glucose 132, BUN 32, creatinine 1.44, sodium 127, potassium 4.4, chloride 101, bicarbonate 18, GFR 44, anion gap 8, osmolality 264, calcium 8.8. Blood alcohol level less than 3. Protein 7.8, albumin 4.1, bilirubin 0.4, alkaline phosphatase 50, AST 22, ALT 25. UDS is negative. WBC 9.2, RBC 4.68, hemoglobin 14.4, hematocrit 41.4, platelet count 206. MCV 89.  MENTAL STATUS EXAMINATION: The patient is an older-looking female who appeared her stated age. She was calm and pleasant. She maintained fair eye contact. Her speech was low in tone and volume. She was awake and alert. She told me that she is currently in the hospital. Her memory is impaired at this time. She demonstrated fair insight and judgment.    DIAGNOSTIC IMPRESSION:  AXIS I: Dementia, not otherwise specified.  AXIS II: None.  AXIS III: Please review the medical history.   TREATMENT PLAN: I discussed the patient's case with the behavioral health staff and will continue on the current medications, including olanzapine 2.5 mg at bedtime and will add Namenda 5 mg daily to help control her behavior. The patient can be discharged when she becomes medically stable, and she can follow up with [facility] as an outpatient.   Thank you for allowing me to participate in the care of this patient.   ____________________________ Ardeen Fillers. Garnetta Buddy, MD usf:lb D: 08/18/2013 15:33:01 ET T: 08/18/2013 16:09:01 ET JOB#: [id number]  cc: Ardeen Fillers. Garnetta Buddy, MD, <Dictator> Rhunette Croft MD ELECTRONICALLY SIGNED 08/25/2013 13:53

## 2015-01-01 NOTE — Consult Note (Signed)
PSychiatry: Seen for follow up. Dementia. Currently calm and denies any psychotic symptoms. No SI or HI. Family ok with her coming home. No longer commitable. Release from IVC and give scripts for psych meds.  Electronic Signatures: Lendy Dittrich, Jackquline DenmarkJohn T (MD)  (Signed on 29-Apr-15 14:07)  Authored  Last Updated: 29-Apr-15 14:07 by Audery Amellapacs, Lasya Vetter T (MD)

## 2015-01-01 NOTE — Discharge Summary (Signed)
PATIENT NAME:  Dominique Burgess, Dominique Burgess MR#:  [id number] DATE OF BIRTH:  1945/10/19  DATE OF ADMISSION:  10/10/2013 DATE OF DISCHARGE:  10/13/2013   CONSULTANTS: Dr. Orlie Dakin from cancer center, Dr. Thedore Mins and Dr. Cherylann Ratel from nephrology.   PRIMARY CARE PHYSICIAN: Dominique Hopes, MD, Jeralyn Ruths.   CHIEF COMPLAINT: Shortness of breath.   DISCHARGE DIAGNOSES:  1. Shortness of breath secondary to mild chronic obstructive pulmonary disease exacerbation.  2. Diabetes.  3. Mild renal failure, resolved.  4. 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  5. Chronic systolic congestive heart failure, last ejection fraction here was normal, but previously was about 45% in 2012.  6. Mediastinal lymphadenopathy, possibly reactive.  7. Left adrenal adenoma.  8. Thyroid nodule.  9. Dementia.  10. History of chronic obstructive pulmonary disease  11. Gastroesophageal reflux disease.  12. Depression.  13. Hypertension.  14 .History of transient ischemic attack.  15. Hyperlipidemia.  16. Diabetes.   DISCHARGE MEDICATIONS:  1. Symbicort 160/4.5 mcg 2 puffs 2 times a day.  2. Atrovent 17 mcg with 2 puffs 4 times a day for COPD. 3. Meclizine 25 mg 3 times a day as needed for dizziness.  4. Lisinopril 20 mg daily.  5. Metformin 500 mg 2 tabs 2 times a day for diabetes.  6. Amlodipine 10 mg daily.  7. Seroquel 50 mg at bedtime. 8. Memantine 5 mg once a day. 9. ProAir 2 puffs every 4 to 6 hours as needed for wheezing.  10. Trazodone 50 mg once a day at bedtime.  11. Pravastatin 40 mg once a day.  12. Aspirin 81 mg daily.  13. Furosemide 40 mg daily.  14. Potassium chloride 10 mEq 2 tabs once a day extended-release.  15. Omeprazole 20 mg 2 caps once a day.  16. Prednisone 30 mg a day for 3 days, then 20 mg a day for 3 days, then 10 mg a day for 3 days, then stop.   DIET: Low sodium, low fat, low cholesterol, ADA diet.   FLUID RESTRICTION:  Limit your total  fluid intake to no more than 1.5 to 2 liters per day.   ACTIVITY: As tolerated.   FOLLOWUP: Please follow with PCP within 1 to 2 weeks. Get scheduled with your PCP to repeat a CAT scan of the chest to re-evaluate lymph nodes and your lungs in 8 to 10 weeks.   DISPOSITION: Home.   SIGNIFICANT LABORATORIES AND IMAGING:  X-ray of the chest, 1 view on January 30th: Suboptimal inspiration accounts for bibasilar atelectasis. No acute cardiopulmonary disease otherwise.  CT of chest without contrast showed right thyroid nodule, numerous nonpathologic mediastinal lymph nodes with 1 node measuring 11 mm which is mildly enlarged from prior diameter of 7 mm. Stable right adrenal fullness, incompletely imaged left adrenal mass. It does appear that the left adrenal mass is larger than it was previously. It is likely an adenoma.  Echocardiogram on the 31st of January showed essentially a normal study, EF of 60% to 65%, normal RVSP.  Initial sodium 127, lowest sodium was 122, last sodium 131. Initial BUN of 14, creatinine 1.29, peak creatinine of 1.41, last creatinine of 1.05. Serum osmolality is 133. Uric acid was 3.5. Urine potassium was 6, urine chloride 13.   HISTORY OF PRESENT ILLNESS AND HOSPITAL COURSE: For full details of H and P, please see the dictation on January 31 by Dr. Clint Guy, but briefly this is a 69 year old female with history of dementia,  COPD, not on oxygen, who came in for shortness of breath. Per ER staff, on the day of admission, she had complained of chest pain, which she had denied to the admitting physician. She got admitted to the hospitalist service for the shortness of breath and mild COPD exacerbation, and ruled out with cyclic cardiac markers. An echocardiogram was also obtained to evaluate the chest pain, which the patient had denied, and echo was essentially a normal study.   In regards to her COPD, this was a mild exacerbation, and she was started on oxygen, DuoNebs and steroids, and  currently, she has been weaned off of the oxygen, and she will be discharged with a prednisone taper and prednisone in her inhalers. While here, she did drop her sodium. Of note, initial sodium was 127, which had dropped to 122, and nephrology was consulted. We suspected her hyponatremia was secondary to primary polydipsia. She drinks free water excessively, and improved with fluid restriction to 131. She had mild renal failure, which also did resolve. As part of the workup for hyponatremia, a CAT scan of the chest was performed to see if she has a lung mass or nodule given her smoking history and the hyponatremia. There are no significant lung masses; however, some mediastinal lymphadenopathy was found, and oncology was consulted, who believed that this was likely reactive, and a repeat CAT scan is recommended by them in about 8 to 10 weeks and referral to oncology if they are persistent or have increased in size. The patient also was noted to have a left adrenal mass and thyroid nodule, which should be followed up as an outpatient.   CODE STATUS: The patient is full code.   TOTAL TIME SPENT: About 40 minutes.  ____________________________ Dominique EatonShayiq Desia Saban, MD sa:lb D: 10/13/2013 10:49:13 ET T: 10/13/2013 12:19:08 ET JOB#: [id number]  cc: Dominique EatonShayiq Naveena Eyman, MD, <Dictator> Dominique HopesHarriett P. Burns, MD Dominique EatonSHAYIQ Sia Gabrielsen MD ELECTRONICALLY SIGNED 10/24/2013 10:57

## 2015-01-01 NOTE — H&P (Signed)
PATIENT NAME:  Dominique Burgess, Dominique Burgess MR#:  [id number] DATE OF BIRTH:  03-Jan-1946  DATE OF ADMISSION:  10/10/2013  REFERRING PHYSICIAN:  Dr. York CeriseForbach.   PRIMARY CARE PHYSICIAN:  Dr. Lawerance BachBurns, [facility].   CHIEF COMPLAINT:  Shortness of breath.   HISTORY OF PRESENT ILLNESS:  The patient is a 69 year old Caucasian female with history of COPD, congestive heart failure as well as mild dementia, presenting with shortness of breath.  Per Emergency Department staff the patient is complaining of chest pain, acute onset of left chest with associated nausea, vomiting, diaphoresis, and shortness of breath, however the patient denies all of these claims, states she is actually here for shortness of breath which has been 1 to 2 weeks in duration, gradually worsening, mainly dyspnea on exertion with associated cough.  Cough is chronic though worsening, is nonproductive.  No fevers, chills.  No recent sick contacts.  On arrival, she was found to be hypoxemic in the Emergency Department requiring supplemental O2.  Currently without complaints.   REVIEW OF SYSTEMS:  CONSTITUTIONAL:  Denies fevers, chills, fatigue, weakness.  EYES:  Denied blurred vision, double vision, eye pain.  EARS, NOSE, THROAT:  Denies tinnitus, ear pain, hearing loss.  RESPIRATORY:  Positive for cough and shortness of breath.  CARDIOVASCULAR:  Denies chest pain, palpitations, edema.  GASTROINTESTINAL:  Denies nausea, vomiting, diarrhea, abdominal pain.  GENITOURINARY:  Denies dysuria, hematuria. ENDOCRINE:  Denies nocturia or thyroid problems.  HEMATOLOGY AND LYMPHATIC:  Denies easy bruising or bleeding.  SKIN:  Denies rash or lesion.  MUSCULOSKELETAL:  Denies pain in neck, back, shoulders, knees, hips or any arthritic symptoms.  NEUROLOGIC:  Denies paralysis, paresthesias.   PSYCHIATRIC:  Denies anxiety or depressive symptoms.  Otherwise, full review of systems performed by me is negative.   PAST MEDICAL HISTORY:  Congestive heart  failure, ejection fraction of 45% back in 2012, COPD non-O2 dependent, gastroesophageal reflux disease, depression, hypertension, TIA, diabetes and hyperlipidemia.   SOCIAL HISTORY:  Current every day tobacco use for the last 28 years.  Denies any alcohol or drug usage.   FAMILY HISTORY:  Positive for coronary artery disease.   ALLERGIES:  BETA BLOCKERS AND PENICILLIN.   HOME MEDICATIONS:  Include aspirin 81 mg by mouth daily, lisinopril 20 mg by mouth daily, trazodone 50 mg by mouth at bedtime, metformin 500 mg 2 tablets by mouth twice daily, meclizine 25 mg by mouth 3 times daily as needed for dizziness, Prilosec 20 mg by mouth daily, Seroquel 50 mg by mouth at bedtime, Atrovent 17 mcg inhalation 2 puffs 4 times daily, ProAir 90 mcg inhalation 2 puffs every 4 to 6 hours as needed, Symbicort 160/4.5 2 puffs twice daily, Norvasc 10 mg by mouth daily, Lasix 40 mg by mouth daily, potassium 10 mEq 2 tablets by mouth daily,, Prilosec 40 mg by mouth daily.   PHYSICAL EXAMINATION: VITAL SIGNS:  Temperature 98.3, heart rate 87, respirations 18, blood pressure 112/60, saturating 97% on supplemental O2.  Weight 93.1 kg, BMI of 32.2.  GENERAL:  Well-nourished, well-developed Caucasian female in no acute distress.  HEAD:  Normocephalic, atraumatic.  EYES:  Pupils equal, round, reactive to light.  Extraocular muscles intact.  No scleral icterus. MOUTH:  Moist mucous membranes.  Dentition intact.  No abscess noted.  EARS, NOSE, THROAT:  Throat clear without exudates.  No external lesions.  No abscess. NECK:  Supple.  No thyromegaly.  No nodules.  No JVD.  PULMONARY:  Diffuse coarse breath sounds most prominent, bibasilar  rhonchi.  No use of accessory muscles.  Good respiratory effort.  CHEST:  Nontender to palpation.  CARDIOVASCULAR:  S1, S2.  Regular rate and rhythm.  No murmurs, rubs or gallops.  No edema.  Pedal pulses 2+ bilaterally.  GASTROINTESTINAL:  Soft, nontender, nondistended.  No mass.  Positive  bowel sounds.  No hepatosplenomegaly.  Obese.  MUSCULOSKELETAL:  No swelling, clubbing, edema.  Range of motion full in all extremities.  NEUROLOGIC:  Cranial nerves II through XII intact.  No gross focal neurological deficits.  Sensation intact.  Reflexes intact.  SKIN:  No ulcerations, lesions, rashes, cyanosis.  Skin warm, dry.  Turgor intact.  PSYCHIATRIC:  Mood and affect within normal limits.  The patient is awake, alert and oriented x 3.  Insight and judgment intact.   LABORATORY DATA:  Chest x-ray performed revealing bibasilar atelectasis though no acute cardiopulmonary process.  EKG performed revealing normal sinus rhythm, heart rate 86.  No ST or T wave abnormalities.  Sodium 127, potassium 4.3, chloride 95, bicarb 23, BUN 14, creatinine 1.29, glucose 144.  Troponin I less than 0.02.  WBC 10.5, hemoglobin 13.7, platelets of 239.   ASSESSMENT AND PLAN:  A 69 year old Caucasian female with history of chronic obstructive pulmonary disease and congestive heart failure, presenting with shortness of breath.  Per Emergency Department staff she apparently had chest pain though the patient denies all these claims.  1.  Chest pain, as this is documented, she will be placed on telemetry.  Trend cardiac enzymes.  She will be given aspirin.  We will follow cardiac trend.  2.  Chronic obstructive pulmonary disease exacerbation.  We will provide supplemental O2 to keep oxygen saturation greater than 92%.  Provide DuoNeb therapy q. 4 hours as well as Solu-Medrol 60 mg IV daily until improvement of symptoms.  She does not require azithromycin as she does not have sputum production at this time.  3.  Diabetes.  Hold by mouth agents.  Add insulin sliding scale.  4.  Hypertension.  Continue Norvasc and Lasix.  5.  Gastroesophageal reflux disease.  Continue with PPI therapy.  6.  Hyperlipidemia.  Continue with pravastatin.  7.  VTE prophylaxis with heparin subQ.  8.  CODE STATUS:  THE PATIENT IS FULL CODE.    TIME SPENT:  45 minutes.    ____________________________ Cletis Athens. Carina Chaplin, MD dkh:ea D: 10/10/2013 00:46:11 ET T: 10/10/2013 04:14:48 ET JOB#: [id number]  cc: Cletis Athens. Daking Westervelt, MD, <Dictator> Charliee Krenz Synetta Shadow MD ELECTRONICALLY SIGNED 10/11/2013 20:33

## 2015-01-01 NOTE — Consult Note (Signed)
PATIENT NAME:  Boger, Dominique Burgess MR#:  [id number] DATE OF BIRTH:  08-02-1946  DATE OF CONSULTOdis LusterON:  01/05/2014  REFERRING PHYSICIAN:  Lucrezia EuropeAllison Webster, MD CONSULTING PHYSICIAN:  Ardeen FillersUzma S. Garnetta BuddyFaheem, MD  REASON FOR CONSULTATION: " I don't know why my grandson did this."  HISTORY OF PRESENT ILLNESS: The patient is a 69 year old divorced Caucasian female who presented to the ED by the police under IVC petition which was started by her grandson. The patient has been refusing to take her medications and has been experiencing auditory hallucinations. She believes that her grandson is trying to kill her. The patient has threatened her grandson and his girlfriend. According to the involuntary commitment, the patient has been tightening her grandson and stated that they are going to shoot her. The patient has a long history of auditory hallucinations and she has been hearing the voices for the past 9 years.   During my interview, the patient was lying in the bed. She reported that she was brought to the Emergency Room by her family member, as she ran out of her medications. She reported that she went to the hospital and has been taking her medications as prescribed. She does not understand why she came to the hospital, reported that she currently lives with her 2 daughters. She currently denied having any perceptual disturbances. She appeared very calm and reported that she stays at home most of the time. She reported that she occasionally sees shadows. She reported that the medications have been helping her. Reported that her memory is good and she remembers most of the stuff. She reported that she does not have any thoughts to harm anybody else at this time.   PAST PSYCHIATRIC HISTORY: The patient has a long history of auditory hallucinations, as well as dementia. [facility] and was discharged on 03/31. She is currently on the combination of Seroquel and Namenda, and she has  been noncompliant with her medications. In the past, she was prescribed olanzapine and trazodone.   MEDICAL CONDITIONS:  Obesity, hypertension, diabetes and GERD.   CURRENT MEDICATIONS: Metformin 500 mg b.i.d., potassium chloride 10 mEq 2 tablets b.i.d., Namenda 7 mg daily, lisinopril 20 mg p.o. daily, Lasix 20 mg half tablet p.o. daily, Prilosec 20 mg daily meclizine 25 mg t.i.d., pravastatin 40 mg p.o. daily, Seroquel 400 mg at bedtime, trazodone 150 mg at bedtime.  MEDICAL PROBLEMS:  GERD, hypertension, hypercholesterolemia, diabetes.   ALLERGIES: PENICILLIN.  REVIEW OF SYSTEMS:  CONSTITUTIONAL: Denies any fever or chills. No weight changes.  EYES: No double or blurred vision.  RESPIRATORY: No shortness of breath or cough.  CARDIOVASCULAR: Denies any chest pain or orthopnea.  GASTROINTESTINAL: No abdominal pain, nausea, vomiting or diarrhea.  ENDOCRINE: No heat or cold intolerance.  LYMPHATIC: No anemia or easy bruising.  INTEGUMENTARY: No acne or rash.  MUSCULOSKELETAL: No muscle or joint pain.   VITAL SIGNS: Temperature 98.2, pulse 90, respirations 18, blood pressure 135/53.  LABORATORY DATA: Glucose 117, BUN 9, creatinine 0.86. Sodium 138, potassium 4.0, chloride 106, bicarbonate 24, anion gap 8, osmolality 276, calcium 9.3. Blood alcohol less than 3. Protein 8.1, albumin 4.0, bilirubin is 0.3. Alkaline phosphatase 52, AST 19, ALT 19. UDS is negative. WBC 12.2, hemoglobin 15.2, hematocrit 45.3. MCV is 88.   MENTAL STATUS EXAMINATION: The patient is an older-looking female who was lying in the bed. She was calm and cooperative. Her mood was fine. Affect was congruent. She maintained fair eye contact. She  was awake and alert. She denied having any perceptual disturbances. She reported that she is currently in the hospital. Her memory appeared intact, as she was able to tell me that she is in the [facility] and this is late April. She was able to spell table forward, but not  backwards.   DIAGNOSTIC IMPRESSION: AXIS I:   Dementia Alzheimer-type.  AXIS II:  None.  AXIS III: Please review the medical history.   TREATMENT PLAN: 1.  I have discussed the patient's case with the behavioral health staff and will place the case management consult so they can help with the patient's disposition at this time.  2.  I will restart her back on Seroquel 100 mg p.o. at bedtime.  3.  I will also start her on Namenda 10 mg p.o. b.i.d.  4.  Behavioral health staff will contact her family and will obtain collateral information.  Thank you for allowing me to participate in the care of this patient.   ____________________________ Ardeen Fillers. Garnetta Buddy, MD usf:ce D: 01/05/2014 15:19:53 ET T: 01/05/2014 16:04:48 ET JOB#: [id number]  cc: Ardeen Fillers. Garnetta Buddy, MD, <Dictator> Rhunette Croft MD ELECTRONICALLY SIGNED 01/12/2014 13:02

## 2015-01-01 NOTE — Consult Note (Signed)
History of Present Illness:  Reason for Consult Mediastinal lymphadenopathy.   HPI   Patient is a 69 year old female who was recently admitted to the hospital with increasing shortness of breath. She also is complaining of chest pain, tendinitis later. Her symptoms are also associated with nausea, vomiting, and diaphoresis. Currently, she feels well and nearly back to her baseline. She has no neurologic complaints. She denies any fevers. She has a good appetite and denies weight loss. She has no night sweats. She has no urinary complaints. Patient otherwise feels well and offers no further specific complaints.  PFSH:  Additional Past Medical and Surgical History COPD CHF, dementia, GERD, hypertension, TIA, diabetes, hyperlipidemia.  Social history: Positive tobacco, 28 pack years. Denies alcohol.  Family history: CAD.   Review of Systems:  Performance Status (ECOG) 1   Review of Systems   As per HPI, otherwise a 10 system review is negative.  NURSING NOTES: **Vital Signs.:   02-Feb-15 12:16   Vital Signs Type: Routine   Temperature Temperature (F): 97.9   Celsius: 36.6   Pulse Pulse: 86   Respirations Respirations: 18   Systolic BP Systolic BP: 161   Diastolic BP (mmHg) Diastolic BP (mmHg): 82   Mean BP: 101   Pulse Ox % Pulse Ox %: 96   Pulse Ox Activity Level: At rest   Oxygen Delivery: Room Air/ 21 %   Physical Exam:  General no acute distress   Lungs: clear   Cardiac: regular rate, rhythm   Abdomen: soft  nontender  positive bowel sounds   Skin: intact   Extremities: No edema, rash or cyanosis   Neuro: AAOx3   Psych: alert and cooperative  mood calm    Penicillin: Rash  Beta Blockers: Unknown    Symbicort 160 mcg-4.5 mcg/inh inhalation aerosol: 2 puff(s) inhaled 2 times a day for asthma, Status: Active, Quantity: 0, Refills: None   Atrovent HFA CFC free 17 mcg/inh inhalation aerosol: 2 puff(s) inhaled 4 times a day for COPD, Status: Active,  Quantity: 0, Refills: None   meclizine 25 mg oral tablet: 1 tab(s) orally 3 times a day, As Needed for dizziness, Status: Active, Quantity: 0, Refills: None   lisinopril 20 mg oral tablet: 1 tab(s) orally once a day for high blood pressure., Status: Active, Quantity: 0, Refills: None   metFORMIN 500 mg oral tablet: 2 tabs (1051m) orally 2 times a day for diabetes, Status: Active, Quantity: 0, Refills: None   amLODIPine 10 mg oral tablet: 1 tab(s) orally once a day, Status: Active, Quantity: 0, Refills: None   SEROquel 50 mg oral tablet: 1 tab(s) orally once a day (at bedtime), Status: Active, Quantity: 0, Refills: None   memantine 5 mg oral tablet: 1 tab(s) orally once a day for dementia., Status: Active, Quantity: 0, Refills: None   ProAir HFA CFC free 90 mcg/inh inhalation aerosol: 2 puff(s) inhaled every 4 to 6 hours as needed for wheezing, cough, or shortness of breath, Status: Active, Quantity: 0, Refills: None   traZODone 50 mg oral tablet: 1 tab(s) orally once a day (at bedtime), Status: Active, Quantity: 0, Refills: None   pravastatin 40 mg oral tablet: 1 tab(s) orally once a day (in the evening), Status: Active, Quantity: 0, Refills: None   aspirin 81 mg oral tablet: 1 tab(s) orally once a day, Status: Active, Quantity: 0, Refills: None   furosemide 40 mg oral tablet: 1 tab(s) orally once a day (in the morning), Status: Active, Quantity: 0, Refills: None  potassium chloride 10 mEq oral tablet, extended release: 2 tabs (95mq) orally once a day for low potassium., Status: Active, Quantity: 0, Refills: None   omeprazole 20 mg oral delayed release capsule: 2 caps (487m orally once a day, Status: Active, Quantity: 0, Refills: None  Laboratory Results: Routine Chem:  02-Feb-15 06:37   Glucose, Serum  154  BUN  20  Creatinine (comp) 0.94  Sodium, Serum  123  Potassium, Serum 4.3  Chloride, Serum  95  CO2, Serum 22  Calcium (Total), Serum 9.4  Anion Gap  6  Osmolality  (calc) 253  eGFR (African American) >60  eGFR (Non-African American) >60 (eGFR values <6040min/1.73 m2 Gorder be an indication of chronic kidney disease (CKD). Calculated eGFR is useful in patients with stable renal function. The eGFR calculation will not be reliable in acutely ill patients when serum creatinine is changing rapidly. It is not useful in  patients on dialysis. The eGFR calculation Basford not be applicable to patients at the low and high extremes of body sizes, pregnant women, and vegetarians.)   Radiology Results: CT:    01-Feb-15 17:04, CT Chest Without Contrast  CT Chest Without Contrast   REASON FOR EXAM:    hyponatremia, significant h/o smoking - evaluate for   mass  COMMENTS:       PROCEDURE: CT  - CT CHEST WITHOUT CONTRAST  - Oct 11 2013  5:04PM     CLINICAL DATA:  Smoker for 28 years, hyponatremia, evaluate for mass    EXAM:  CT CHEST WITHOUT CONTRAST    TECHNIQUE:  Multidetector CT imaging of the chest was performed following the  standard protocol without IV contrast.  COMPARISON:  October 09, 2013 chest radiograph and CT scan  09/16/2009    FINDINGS:  1.5 nodule off of the right thyroid, mildly heterogeneous with a  focus of calcification. Thoracic inlet otherwise normal. Mild  calcification of the thoracic aorta. Mild coronary arterial  calcification and very mild pericardial thickening. No significant  hilar or mediastinal lymphadenopathy. The largest lymph node is a  paratracheal node measuring 11 mm. There are no pleural effusions.    Lungs are clear.  No mass or infiltrate.    Scans through the upper abdomen show evidence of mild adrenal  fullness although the right adrenal gland is not fully imaged.  Similarly the left adrenal gland is not fully imaged. However, there  is a left adrenal mass with average attenuation value of 0. It  measures at least 25 x 14 mm but it is not completely imaged.    There are no acute musculoskeletal findings.  There is significant  degenerative disc disease throughout the thoracic spine.     IMPRESSION:  1. Right thyroid nodule. The appearance is similar to the prior  study. It could be further characterized with thyroid ultrasound.  2. Numerous nonpathologic mediastinal lymph nodes with 1 node  measuring 11 mm which is mildly enlarged from prior diameter of 7  mm. This is nonspecific but felt to be likely reactive.  3. Stable right adrenal fullness. Incompletely imaged left adrenal  mass. It does appear that the left adrenal mass is larger than it  was previously, although still within size range to be likely benign  particularly given the attenuation value. It is likely an adenoma.      Electronically Signed    By: RaySkipper ClicheD.    On: 10/11/2013 17:17         Verified By:  Rachael Fee, M.D.,   Assessment and Plan: Impression:   Mild mediastinal lymphadenopathy. Plan:   1. Mediastinal lymphadenopathy: Given the patient's underlying pulmonary issues, these are most likely reactive. They are likely too small to characterize by PET scan. Recommend treating her underlying pulmonary problems and then repeating in CT scan in 8-10 weeks to assess for interval change.  If the lymph nodes are still present or have increased in size, please refer to the cancer center for further evaluation consult, call with questions.  Electronic Signatures: Delight Hoh (MD)  (Signed [phone] 17:27)  Authored: HISTORY OF PRESENT ILLNESS, PFSH, ROS, NURSING NOTES, PE, ALLERGIES, HOME MEDICATIONS, LABS, OTHER RESULTS, ASSESSMENT AND PLAN   Last Updated: 02-Feb-15 17:27 by Delight Hoh (MD)

## 2015-01-01 NOTE — Consult Note (Signed)
PATIENT NAME:  Dominique Burgess, Dominique Burgess MR#:  [id number] DATE OF BIRTH:  [birth year]  DATE OF CONSULTATION:  11/07/2013  REFERRING PHYSICIAN:   CONSULTING PHYSICIAN:  Arden Tinoco Burgess. Guss Bundehalla, MD  [facility]  [facility], [place], [place].  AGE:  69 years.  SEX:  Female.  RACE:  White.  HISTORY OF PRESENT ILLNESS:  The patient is a 69 year old white female with a diagnosis of Alzheimer's dementia.  The patient is on disability and lives with her daughter who is 10120 years old.  The patient was brought to the Emergency Room with a chief complaint "hearing voices, my daughter called them, I walked several times around the apartment.  I don't know what they say.  The chuckle."  According to information obtained from the chart the patient has been hearing two voices, once of it takes her breath away and the other one just talks to her.  She has been hearing these voices for 2 years, daily and constantly.  The patient has been inpatient hospitalization psychiatry before, but she does not remember the reason.    MENTAL STATUS EXAMINATION:  The patient is dressed in hospital clothes.  Affect is flat with mood restricted.  Admits feeling depressed.  She knew the name of the state, [place].  She did not know date and time.  She knew her name.  Admits feeling hopeless and helpless.  Admits feeling low and down and depressed.  Admits hearing voices and sometimes these voices talk to her and she says they all sorts of things  Sometimes these voices are good.  Sometimes they are bad.  Not able to focus, pay attention because of these voices.  Denies any suicidal wishes, but being bothered by voices.  Cognition is below average.  General knowledge of information is below average and very basic.  Insight and judgment guarded.   IMPRESSION:  Alzheimer's dementia with psychosis.   RECOMMEND:  The patient will be started on Aricept and Haldol 1 mg by mouth twice daily  to help her with her psychosis.  The patient is to be evaluated for appropriate [facility] a [facility] or any hospital at a geriatric floor so that she can get appropriate help for her problems.     ____________________________ Jannet MantisSurya Burgess. Guss Bundehalla, MD skc:ea D: 11/07/2013 23:55:47 ET T: 11/08/2013 03:42:17 ET JOB#: [id number]  cc: Monika SalkSurya Burgess. Guss Bundehalla, MD, <Dictator> Beau FannySURYA Burgess Fantashia Shupert MD ELECTRONICALLY SIGNED 11/08/2013 17:42

## 2015-01-31 IMAGING — CR DG CHEST 2V
1 series · 2 of 2 positions shown · non-contrast
Comparison: 10/09/2013

CLINICAL DATA: Shortness of Breath

EXAM:
CHEST - 2 VIEW

[Series 1: w chest pa · 0.14mm/px · 2 of 2 slices shown]
[im 1/2]
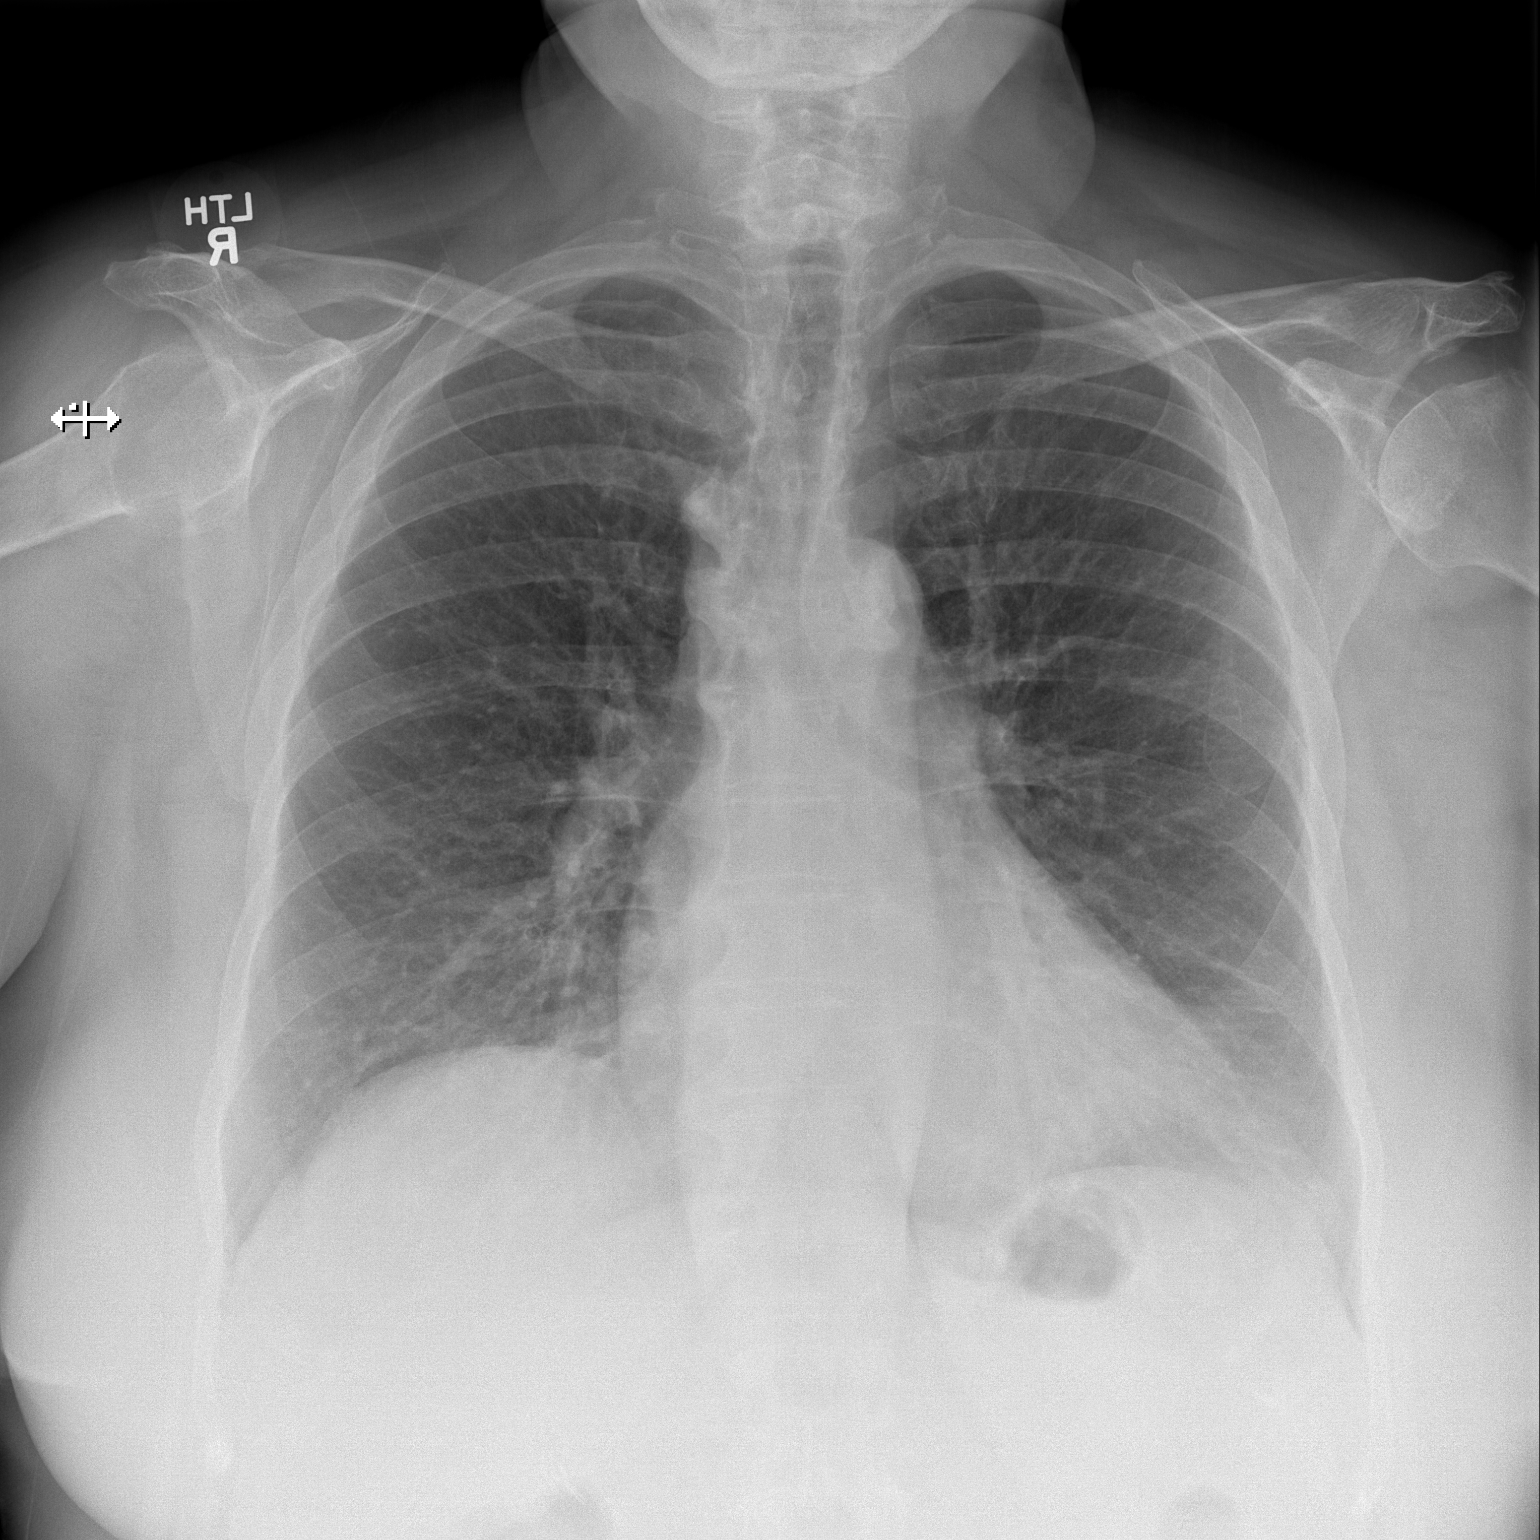
[im 2/2]
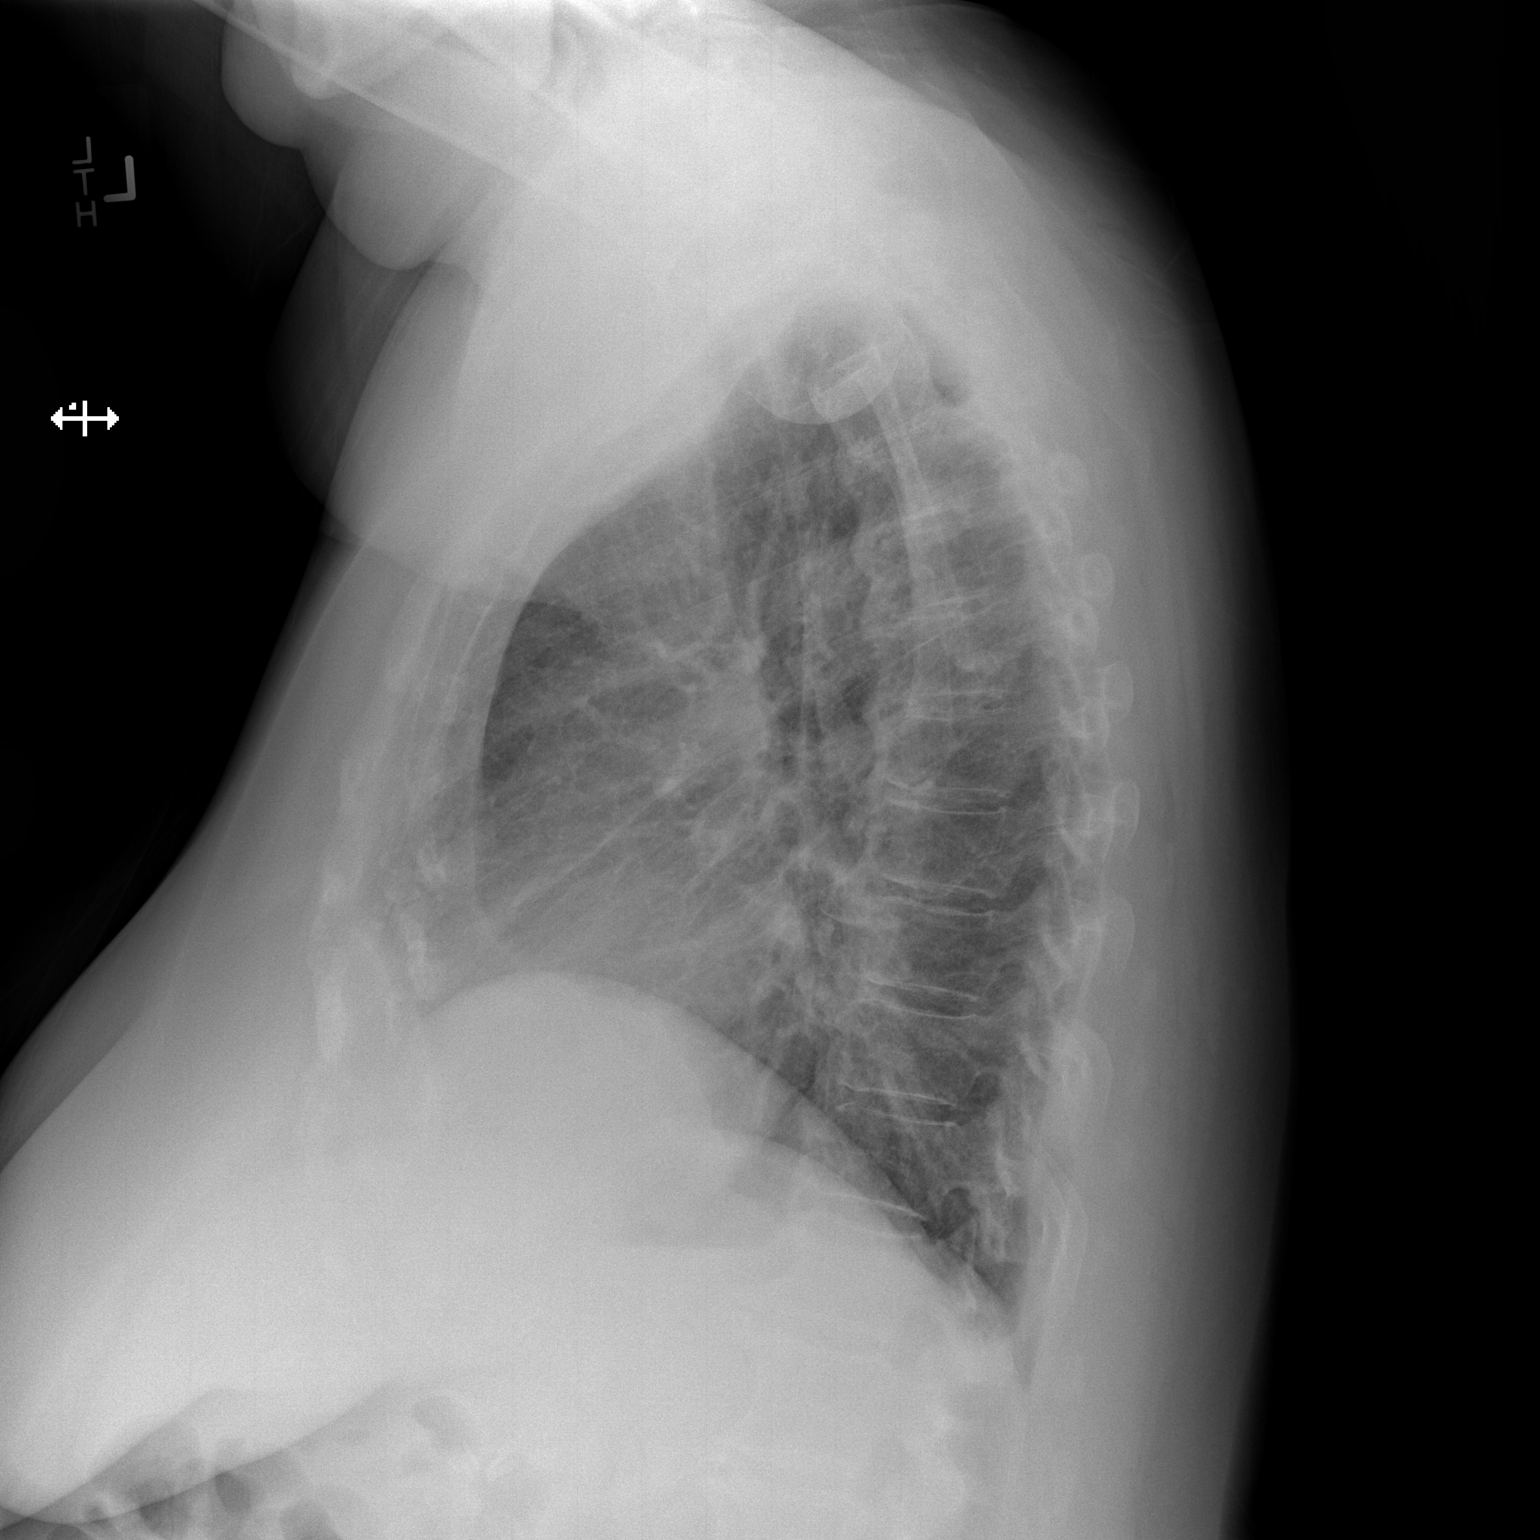

[2 of 2 positions shown; findings below may reference images not displayed]

FINDINGS: Coarse bibasilar interstitial markings. No confluent airspace
infiltrate or overt edema. Heart size upper limits normal. .
No effusion.
Flowing osteophytes in the mid and lower thoracic spine. Surgical
clips in the right upper abdomen.
IMPRESSION: No acute cardiopulmonary disease.

## 2015-03-09 ENCOUNTER — Encounter: Payer: Self-pay | Admitting: Emergency Medicine

## 2015-03-09 ENCOUNTER — Inpatient Hospital Stay
Admission: EM | Admit: 2015-03-09 | Discharge: 2015-03-14 | DRG: 291 | Disposition: A | Discharge status: Home / Self Care | Payer: Medicare Other | Attending: Specialist | Admitting: Specialist

## 2015-03-09 ENCOUNTER — Emergency Department: Payer: Medicare Other

## 2015-03-09 DIAGNOSIS — Z880 Allergy status to penicillin: Secondary | ICD-10-CM

## 2015-03-09 DIAGNOSIS — Z79899 Other long term (current) drug therapy: Secondary | ICD-10-CM | POA: Diagnosis not present

## 2015-03-09 DIAGNOSIS — Z823 Family history of stroke: Secondary | ICD-10-CM

## 2015-03-09 DIAGNOSIS — J209 Acute bronchitis, unspecified: Secondary | ICD-10-CM | POA: Diagnosis present

## 2015-03-09 DIAGNOSIS — E119 Type 2 diabetes mellitus without complications: Secondary | ICD-10-CM

## 2015-03-09 DIAGNOSIS — J441 Chronic obstructive pulmonary disease with (acute) exacerbation: Secondary | ICD-10-CM | POA: Diagnosis present

## 2015-03-09 DIAGNOSIS — G4733 Obstructive sleep apnea (adult) (pediatric): Secondary | ICD-10-CM | POA: Diagnosis present

## 2015-03-09 DIAGNOSIS — T380X5A Adverse effect of glucocorticoids and synthetic analogues, initial encounter: Secondary | ICD-10-CM | POA: Diagnosis present

## 2015-03-09 DIAGNOSIS — Z90710 Acquired absence of both cervix and uterus: Secondary | ICD-10-CM

## 2015-03-09 DIAGNOSIS — R0600 Dyspnea, unspecified: Secondary | ICD-10-CM | POA: Diagnosis present

## 2015-03-09 DIAGNOSIS — I10 Essential (primary) hypertension: Secondary | ICD-10-CM | POA: Diagnosis present

## 2015-03-09 DIAGNOSIS — Z6838 Body mass index (BMI) 38.0-38.9, adult: Secondary | ICD-10-CM

## 2015-03-09 DIAGNOSIS — Z7951 Long term (current) use of inhaled steroids: Secondary | ICD-10-CM

## 2015-03-09 DIAGNOSIS — J9601 Acute respiratory failure with hypoxia: Secondary | ICD-10-CM | POA: Diagnosis present

## 2015-03-09 DIAGNOSIS — E785 Hyperlipidemia, unspecified: Secondary | ICD-10-CM | POA: Diagnosis present

## 2015-03-09 DIAGNOSIS — Z9049 Acquired absence of other specified parts of digestive tract: Secondary | ICD-10-CM | POA: Diagnosis present

## 2015-03-09 DIAGNOSIS — I5033 Acute on chronic diastolic (congestive) heart failure: Principal | ICD-10-CM | POA: Diagnosis present

## 2015-03-09 DIAGNOSIS — Z8249 Family history of ischemic heart disease and other diseases of the circulatory system: Secondary | ICD-10-CM | POA: Diagnosis not present

## 2015-03-09 DIAGNOSIS — E669 Obesity, unspecified: Secondary | ICD-10-CM | POA: Diagnosis present

## 2015-03-09 DIAGNOSIS — R0602 Shortness of breath: Secondary | ICD-10-CM

## 2015-03-09 DIAGNOSIS — R0603 Acute respiratory distress: Secondary | ICD-10-CM

## 2015-03-09 DIAGNOSIS — J440 Chronic obstructive pulmonary disease with acute lower respiratory infection: Secondary | ICD-10-CM | POA: Diagnosis present

## 2015-03-09 DIAGNOSIS — F0390 Unspecified dementia without behavioral disturbance: Secondary | ICD-10-CM | POA: Diagnosis present

## 2015-03-09 DIAGNOSIS — I5031 Acute diastolic (congestive) heart failure: Secondary | ICD-10-CM | POA: Diagnosis not present

## 2015-03-09 DIAGNOSIS — F17210 Nicotine dependence, cigarettes, uncomplicated: Secondary | ICD-10-CM | POA: Diagnosis present

## 2015-03-09 HISTORY — DX: Chronic obstructive pulmonary disease, unspecified: J44.9

## 2015-03-09 HISTORY — DX: Unspecified dementia, unspecified severity, without behavioral disturbance, psychotic disturbance, mood disturbance, and anxiety: F03.90

## 2015-03-09 HISTORY — DX: Type 2 diabetes mellitus without complications: E11.9

## 2015-03-09 HISTORY — DX: Heart failure, unspecified: I50.9

## 2015-03-09 HISTORY — DX: Essential (primary) hypertension: I10

## 2015-03-09 HISTORY — DX: Hyperlipidemia, unspecified: E78.5

## 2015-03-09 LAB — CBC WITH DIFFERENTIAL/PLATELET
Basophils Absolute: 0 10*3/uL (ref 0–0.1)
Basophils Relative: 1 %
Eosinophils Absolute: 0.1 10*3/uL (ref 0–0.7)
Eosinophils Relative: 1 %
HCT: 37.1 % (ref 35.0–47.0)
Hemoglobin: 11.9 g/dL — ABNORMAL LOW (ref 12.0–16.0)
Lymphocytes Relative: 15 %
Lymphs Abs: 1.4 10*3/uL (ref 1.0–3.6)
MCH: 26.3 pg (ref 26.0–34.0)
MCHC: 32.2 g/dL (ref 32.0–36.0)
MCV: 81.8 fL (ref 80.0–100.0)
Monocytes Absolute: 0.4 10*3/uL (ref 0.2–0.9)
Monocytes Relative: 4 %
Neutro Abs: 7.7 10*3/uL — ABNORMAL HIGH (ref 1.4–6.5)
Neutrophils Relative %: 79 %
Platelets: 278 10*3/uL (ref 150–440)
RBC: 4.53 MIL/uL (ref 3.80–5.20)
RDW: 17.3 % — ABNORMAL HIGH (ref 11.5–14.5)
WBC: 9.6 10*3/uL (ref 3.6–11.0)

## 2015-03-09 LAB — TROPONIN I: Troponin I: <0.03 ng/mL (ref <0.031)

## 2015-03-09 LAB — BASIC METABOLIC PANEL
Anion gap: 14 (ref 5–15)
BUN: 6 mg/dL (ref 6–20)
CO2: 28 mmol/L (ref 22–32)
Calcium: 7.7 mg/dL — ABNORMAL LOW (ref 8.9–10.3)
Chloride: 93 mmol/L — ABNORMAL LOW (ref 101–111)
Creatinine, Ser: 0.82 mg/dL (ref 0.44–1.00)
GFR calc Af Amer: >60 mL/min (ref >60)
GFR calc non Af Amer: >60 mL/min (ref >60)
Glucose, Bld: 152 mg/dL — ABNORMAL HIGH (ref 65–99)
Potassium: 3.4 mmol/L — ABNORMAL LOW (ref 3.5–5.1)
Sodium: 135 mmol/L (ref 135–145)

## 2015-03-09 LAB — GLUCOSE, CAPILLARY
Glucose-Capillary: 235 mg/dL — ABNORMAL HIGH (ref 65–99)
Glucose-Capillary: 318 mg/dL — ABNORMAL HIGH (ref 65–99)

## 2015-03-09 LAB — BRAIN NATRIURETIC PEPTIDE: B Natriuretic Peptide: 131 pg/mL — ABNORMAL HIGH (ref 0.0–100.0)

## 2015-03-09 MED ORDER — POTASSIUM CHLORIDE CRYS ER 10 MEQ PO TBCR
10.0000 meq | EXTENDED_RELEASE_TABLET | Freq: Two times a day (BID) | ORAL | Status: DC
  Administered 2015-03-09 – 2015-03-12 (×6): 10 meq via ORAL
  Filled 2015-03-09 (×6): qty 1

## 2015-03-09 MED ORDER — METHYLPREDNISOLONE SODIUM SUCC 125 MG IJ SOLR
INTRAMUSCULAR | Status: AC
  Administered 2015-03-09: 125 mg via INTRAVENOUS
  Filled 2015-03-09: qty 2

## 2015-03-09 MED ORDER — PANTOPRAZOLE SODIUM 40 MG PO TBEC
40.0000 mg | DELAYED_RELEASE_TABLET | Freq: Every day | ORAL | Status: DC
  Administered 2015-03-10 – 2015-03-14 (×5): 40 mg via ORAL
  Filled 2015-03-09 (×5): qty 1

## 2015-03-09 MED ORDER — FUROSEMIDE 10 MG/ML IJ SOLN
INTRAMUSCULAR | Status: AC
  Administered 2015-03-09: 40 mg via INTRAVENOUS
  Filled 2015-03-09: qty 4

## 2015-03-09 MED ORDER — MORPHINE SULFATE 2 MG/ML IJ SOLN
INTRAMUSCULAR | Status: AC
  Administered 2015-03-09: 2 mg via INTRAVENOUS
  Filled 2015-03-09: qty 1

## 2015-03-09 MED ORDER — TRAZODONE HCL 100 MG PO TABS
100.0000 mg | ORAL_TABLET | Freq: Every day | ORAL | Status: DC
  Administered 2015-03-09 – 2015-03-13 (×5): 100 mg via ORAL
  Filled 2015-03-09 (×5): qty 1

## 2015-03-09 MED ORDER — ALBUTEROL SULFATE (2.5 MG/3ML) 0.083% IN NEBU: 3.0000 mL | INHALATION_SOLUTION | Freq: Four times a day (QID) | RESPIRATORY_TRACT | Status: DC | PRN

## 2015-03-09 MED ORDER — BUDESONIDE-FORMOTEROL FUMARATE 160-4.5 MCG/ACT IN AERO
2.0000 | INHALATION_SPRAY | Freq: Two times a day (BID) | RESPIRATORY_TRACT | Status: DC
  Administered 2015-03-09 – 2015-03-14 (×9): 2 via RESPIRATORY_TRACT
  Filled 2015-03-09 (×2): qty 6

## 2015-03-09 MED ORDER — METHYLPREDNISOLONE SODIUM SUCC 40 MG IJ SOLR
40.0000 mg | Freq: Four times a day (QID) | INTRAMUSCULAR | Status: DC
  Administered 2015-03-09 – 2015-03-10 (×4): 40 mg via INTRAVENOUS
  Filled 2015-03-09 (×4): qty 1

## 2015-03-09 MED ORDER — TIOTROPIUM BROMIDE MONOHYDRATE 18 MCG IN CAPS
18.0000 ug | ORAL_CAPSULE | Freq: Every day | RESPIRATORY_TRACT | Status: DC
  Administered 2015-03-10 – 2015-03-14 (×3): 18 ug via RESPIRATORY_TRACT
  Filled 2015-03-09 (×2): qty 5

## 2015-03-09 MED ORDER — ENOXAPARIN SODIUM 40 MG/0.4ML ~~LOC~~ SOLN
40.0000 mg | SUBCUTANEOUS | Status: DC
  Administered 2015-03-09 – 2015-03-13 (×5): 40 mg via SUBCUTANEOUS
  Filled 2015-03-09 (×5): qty 0.4

## 2015-03-09 MED ORDER — ACETAMINOPHEN 325 MG PO TABS
650.0000 mg | ORAL_TABLET | Freq: Four times a day (QID) | ORAL | Status: DC | PRN
  Administered 2015-03-12 – 2015-03-14 (×4): 650 mg via ORAL
  Filled 2015-03-09 (×4): qty 2

## 2015-03-09 MED ORDER — LISINOPRIL 20 MG PO TABS
20.0000 mg | ORAL_TABLET | Freq: Every day | ORAL | Status: DC
  Administered 2015-03-10 – 2015-03-14 (×5): 20 mg via ORAL
  Filled 2015-03-09 (×5): qty 1

## 2015-03-09 MED ORDER — MORPHINE SULFATE 2 MG/ML IJ SOLN
2.0000 mg | INTRAMUSCULAR | Status: DC | PRN
  Administered 2015-03-09 – 2015-03-10 (×3): 2 mg via INTRAVENOUS
  Filled 2015-03-09 (×3): qty 1

## 2015-03-09 MED ORDER — HYDROCODONE-ACETAMINOPHEN 5-325 MG PO TABS
1.0000 | ORAL_TABLET | ORAL | Status: DC | PRN
  Administered 2015-03-09 – 2015-03-11 (×4): 2 via ORAL
  Filled 2015-03-09 (×4): qty 2

## 2015-03-09 MED ORDER — INSULIN ASPART 100 UNIT/ML ~~LOC~~ SOLN
0.0000 [IU] | Freq: Three times a day (TID) | SUBCUTANEOUS | Status: DC
  Administered 2015-03-10: 09:00:00 8 [IU] via SUBCUTANEOUS
  Administered 2015-03-10: 13:00:00 5 [IU] via SUBCUTANEOUS
  Administered 2015-03-10: 17:00:00 3 [IU] via SUBCUTANEOUS
  Administered 2015-03-11 (×2): 5 [IU] via SUBCUTANEOUS
  Administered 2015-03-12 (×2): 3 [IU] via SUBCUTANEOUS
  Administered 2015-03-12: 5 [IU] via SUBCUTANEOUS
  Administered 2015-03-13 (×2): 3 [IU] via SUBCUTANEOUS
  Administered 2015-03-13: 17:00:00 8 [IU] via SUBCUTANEOUS
  Administered 2015-03-14: 5 [IU] via SUBCUTANEOUS
  Filled 2015-03-09: qty 8
  Filled 2015-03-09: qty 3
  Filled 2015-03-09: qty 5
  Filled 2015-03-09 (×2): qty 3
  Filled 2015-03-09: qty 8
  Filled 2015-03-09 (×3): qty 3
  Filled 2015-03-09: qty 5
  Filled 2015-03-09: qty 8

## 2015-03-09 MED ORDER — ONDANSETRON HCL 4 MG PO TABS
4.0000 mg | ORAL_TABLET | Freq: Four times a day (QID) | ORAL | Status: DC | PRN
  Administered 2015-03-11: 22:00:00 4 mg via ORAL
  Filled 2015-03-09: qty 1

## 2015-03-09 MED ORDER — IPRATROPIUM-ALBUTEROL 0.5-2.5 (3) MG/3ML IN SOLN
3.0000 mL | RESPIRATORY_TRACT | Status: DC
Start: 2015-03-09 — End: 2015-03-12
  Administered 2015-03-09 – 2015-03-12 (×19): 3 mL via RESPIRATORY_TRACT
  Filled 2015-03-09 (×19): qty 3

## 2015-03-09 MED ORDER — METFORMIN HCL 500 MG PO TABS
1000.0000 mg | ORAL_TABLET | Freq: Two times a day (BID) | ORAL | Status: DC
  Administered 2015-03-09 – 2015-03-14 (×10): 1000 mg via ORAL
  Filled 2015-03-09 (×10): qty 2

## 2015-03-09 MED ORDER — MEMANTINE HCL ER 7 MG PO CP24
7.0000 mg | ORAL_CAPSULE | Freq: Every day | ORAL | Status: DC
  Administered 2015-03-10 – 2015-03-14 (×4): 7 mg via ORAL
  Filled 2015-03-09 (×5): qty 1

## 2015-03-09 MED ORDER — PRAVASTATIN SODIUM 20 MG PO TABS
40.0000 mg | ORAL_TABLET | Freq: Every day | ORAL | Status: DC
  Administered 2015-03-09 – 2015-03-13 (×5): 40 mg via ORAL
  Filled 2015-03-09 (×5): qty 2

## 2015-03-09 MED ORDER — NAPROXEN 500 MG PO TABS: 500.0000 mg | ORAL_TABLET | Freq: Every day | ORAL | Status: DC | PRN

## 2015-03-09 MED ORDER — AMLODIPINE BESYLATE 10 MG PO TABS
10.0000 mg | ORAL_TABLET | Freq: Every day | ORAL | Status: DC
  Administered 2015-03-10 – 2015-03-14 (×5): 10 mg via ORAL
  Filled 2015-03-09 (×5): qty 1

## 2015-03-09 MED ORDER — ACETAMINOPHEN 650 MG RE SUPP: 650.0000 mg | Freq: Four times a day (QID) | RECTAL | Status: DC | PRN

## 2015-03-09 MED ORDER — LEVOFLOXACIN IN D5W 750 MG/150ML IV SOLN
750.0000 mg | INTRAVENOUS | Status: DC
  Administered 2015-03-09: 750 mg via INTRAVENOUS
  Filled 2015-03-09 (×2): qty 150

## 2015-03-09 MED ORDER — ONDANSETRON HCL 4 MG/2ML IJ SOLN
4.0000 mg | Freq: Four times a day (QID) | INTRAMUSCULAR | Status: DC | PRN
  Administered 2015-03-10 (×2): 4 mg via INTRAVENOUS
  Filled 2015-03-09 (×2): qty 2

## 2015-03-09 MED ORDER — INSULIN ASPART 100 UNIT/ML ~~LOC~~ SOLN
0.0000 [IU] | Freq: Every day | SUBCUTANEOUS | Status: DC
  Administered 2015-03-09: 22:00:00 4 [IU] via SUBCUTANEOUS
  Administered 2015-03-10: 2 [IU] via SUBCUTANEOUS
  Administered 2015-03-11: 22:00:00 5 [IU] via SUBCUTANEOUS
  Filled 2015-03-09 (×2): qty 5
  Filled 2015-03-09: qty 4
  Filled 2015-03-09: qty 1
  Filled 2015-03-09: qty 2

## 2015-03-09 MED ORDER — FUROSEMIDE 10 MG/ML IJ SOLN
40.0000 mg | Freq: Once | INTRAMUSCULAR | Status: AC
  Administered 2015-03-09: 40 mg via INTRAVENOUS

## 2015-03-09 MED ORDER — IPRATROPIUM-ALBUTEROL 0.5-2.5 (3) MG/3ML IN SOLN: 3.0000 mL | RESPIRATORY_TRACT | Status: AC

## 2015-03-09 MED ORDER — FUROSEMIDE 20 MG PO TABS
40.0000 mg | ORAL_TABLET | Freq: Every day | ORAL | Status: DC
  Administered 2015-03-10 – 2015-03-14 (×5): 40 mg via ORAL
  Filled 2015-03-09 (×5): qty 2

## 2015-03-09 MED ORDER — MORPHINE SULFATE 2 MG/ML IJ SOLN
2.0000 mg | Freq: Once | INTRAMUSCULAR | Status: AC
  Administered 2015-03-09: 2 mg via INTRAVENOUS

## 2015-03-09 MED ORDER — IPRATROPIUM-ALBUTEROL 0.5-2.5 (3) MG/3ML IN SOLN
RESPIRATORY_TRACT | Status: AC
Start: 2015-03-09 — End: 2015-03-10
  Filled 2015-03-09: qty 9

## 2015-03-09 MED ORDER — ALBUTEROL SULFATE (2.5 MG/3ML) 0.083% IN NEBU
INHALATION_SOLUTION | RESPIRATORY_TRACT | Status: AC
  Administered 2015-03-09: 2.5 mg via RESPIRATORY_TRACT
  Filled 2015-03-09: qty 3

## 2015-03-09 MED ORDER — METHYLPREDNISOLONE SODIUM SUCC 125 MG IJ SOLR
125.0000 mg | Freq: Once | INTRAMUSCULAR | Status: AC
  Administered 2015-03-09: 125 mg via INTRAVENOUS

## 2015-03-09 MED ORDER — ALBUTEROL SULFATE (2.5 MG/3ML) 0.083% IN NEBU
2.5000 mg | INHALATION_SOLUTION | Freq: Once | RESPIRATORY_TRACT | Status: AC
  Administered 2015-03-09: 2.5 mg via RESPIRATORY_TRACT

## 2015-03-09 NOTE — ED Notes (Signed)
pt via ems with SOB progressively worse since this am. Pt has COPD and has had previous exacerbations that required hospitalization. Pt on NRB mask at 15L, unable to tolerate CPAP per EMS and sats were in 70's Pt alert & oriented.

## 2015-03-09 NOTE — ED Provider Notes (Signed)
[facility] Emergency Department Provider Note    ____________________________________________  Time seen: On EMS arrival  I have reviewed the triage vital signs and the nursing notes.   HISTORY  Chief Complaint Shortness of Breath   History limited by: Not Limited   HPI Dominique Burgess is a 69 y.o. female with history of COPD who presents to the emergency department today with shortness of breath. She states that it started getting bad yesterday evening. She did take her nebulized treatment with some relief. However she woke up this a.m. with worsening shortness of breath. Was gradual and also has been continuous. When first responders arrived on scene she had O2 sats in the 70s. She denies any associated shortness of breath. Having a mildly productive cough. She denies any fevers.     Past Medical History  Diagnosis Date  . COPD (chronic obstructive pulmonary disease)     There are no active problems to display for this patient.   History reviewed. No pertinent past surgical history.  No current outpatient prescriptions on file.  Allergies Review of patient's allergies indicates not on file.  No family history on file.  Social History History  Substance Use Topics  . Smoking status: Not on file  . Smokeless tobacco: Not on file  . Alcohol Use: Not on file    Review of Systems  Constitutional: Negative for fever. Cardiovascular: Negative for chest pain. Respiratory: Positive for shortness of breath. Gastrointestinal: Negative for abdominal pain, vomiting and diarrhea. Genitourinary: Negative for dysuria. Musculoskeletal: Negative for back pain. Skin: Negative for rash. Neurological: Negative for headaches, focal weakness or numbness.   10-point ROS otherwise negative.  ____________________________________________   PHYSICAL EXAM:  VITAL SIGNS: ED Triage Vitals  Enc Vitals Group     BP 03/09/15 1303 147/84 mmHg     Pulse  Rate 03/09/15 1303 103     Resp 03/09/15 1303 21     Temp 03/09/15 1303 98.5 F (36.9 C)     Temp Source 03/09/15 1303 Oral     SpO2 03/09/15 1303 93 %     Weight 03/09/15 1303 208 lb (94.348 kg)     Height 03/09/15 1303  (1.575 m)   Constitutional: Alert and oriented. Moderate respiratory distress Eyes: Conjunctivae are normal. PERRL. Normal extraocular movements. ENT   Head: Normocephalic and atraumatic.   Nose: No congestion/rhinnorhea.   Mouth/Throat: Mucous membranes are moist.   Neck: No stridor. Hematological/Lymphatic/Immunilogical: No cervical lymphadenopathy. Cardiovascular: Normal rate, regular rhythm.  No murmurs, rubs, or gallops. Respiratory: Moderate increased respiratory effort. Moderate respiratory distress.bilateral diffuse wheezing. Gastrointestinal: Soft and nontender. No distention.  Genitourinary: Deferred Musculoskeletal: Normal range of motion in all extremities. No joint effusions.  No lower extremity tenderness nor edema. Neurologic:  Normal speech and language. No gross focal neurologic deficits are appreciated. Speech is normal.  Skin:  Skin is warm, dry and intact. No rash noted. Psychiatric: Mood and affect are normal. Speech and behavior are normal. Patient exhibits appropriate insight and judgment.  ____________________________________________    LABS (pertinent positives/negatives)  Labs Reviewed  CBC WITH DIFFERENTIAL/PLATELET - Abnormal; Notable for the following:    Hemoglobin 11.9 (*)    RDW 17.3 (*)    Neutro Abs 7.7 (*)    All other components within normal limits  BASIC METABOLIC PANEL - Abnormal; Notable for the following:    Potassium 3.4 (*)    Chloride 93 (*)    Glucose, Bld 152 (*)    Calcium  7.7 (*)    All other components within normal limits  BRAIN NATRIURETIC PEPTIDE - Abnormal; Notable for the following:    B Natriuretic Peptide 131.0 (*)    All other components within normal limits  TROPONIN I      ____________________________________________   EKG  I, Phineas SemenGraydon Takya Vandivier, attending physician, personally viewed and interpreted this EKG  EKG Time: 1307 Rate: 99 Rhythm: NSR Axis: Normal Intervals: qtc 456 QRS: Narrow ST changes: no st elevation    ____________________________________________    RADIOLOGY  Chest x-ray IMPRESSION: 1. Small left pleural effusion with associated atelectasis. Infiltrate is difficult to exclude radiographically. 2. Suspect trace right pleural effusion as well. 3. Pulmonary vascular congestion without overt edema. ____________________________________________   PROCEDURES  Procedure(s) performed: None  Critical Care performed: No  CRITICAL CARE Performed by: Phineas SemenGOODMAN, Caitlyne Ingham   Total critical care time: 30  Critical care time was exclusive of separately billable procedures and treating other patients.  Critical care was necessary to treat or prevent imminent or life-threatening deterioration.  Critical care was time spent personally by me on the following activities: development of treatment plan with patient and/or surrogate as well as nursing, discussions with consultants, evaluation of patient's response to treatment, examination of patient, obtaining history from patient or surrogate, ordering and performing treatments and interventions, ordering and review of laboratory studies, ordering and review of radiographic studies, pulse oximetry and re-evaluation of patient's condition.   ____________________________________________   INITIAL IMPRESSION / ASSESSMENT AND PLAN / ED COURSE  Pertinent labs & imaging results that were available during my care of the patient were reviewed by me and considered in my medical decision making (see chart for details).  Patient presented to the emergency department with shortness of breath.on exam patient with bilateral diffuse wheezing. Patient moderate respiratory distress.patient was  given 3 DuoNeb's and Solu-Medrol. After this treatment patient still with story distress. I did write for patient to get continuous and nebulizer after attempting patient on room air getting sats in the mid 70s. Furthermore given persistent hypoxia on room air patient would benefit from inpatient admission and further treatment and workup.  ____________________________________________   FINAL CLINICAL IMPRESSION(S) / ED DIAGNOSES  Final diagnoses:  SOB (shortness of breath)  Respiratory distress     Phineas SemenGraydon Teaghan Melrose, MD 03/09/15 [phone]

## 2015-03-09 NOTE — ED Notes (Signed)
Pt via ems from home, where she lives with daughter. States that she began having increasing difficulty breathing last night and it has gotten progressively worse since then. Pt called EMS, who state her O2 sats were in the 70's and they placed her on CPAP, which didn't help, and she didn't tolerate well. She was placed on non-rebreather at 15L and transported to us. Pt is now on 5L via nasal cannula and sats are in mid-90's. Pt alert & oriented with NAD.

## 2015-03-09 NOTE — Therapy (Signed)
Patient admitted via EMS on NRB. SpO2 100%, O2 changed to [place] at 5 lpm. SpO2 93%. Patient verbal but with prolonged expiratory phase, wheezes, rhonchi, rales. Patient stated she has had a productive cough with green sputum with occasional hemoptysis. Continuous neb started.

## 2015-03-09 NOTE — Progress Notes (Signed)
Pt having severe pain 8/10.  MD notified, new orders entered for morphine. Mickel Duhamelynthia W Zykiria Bruening, RN

## 2015-03-09 NOTE — H&P (Signed)
[facility] - [place] at [facility]   PATIENT NAME: Dominique Burgess    MR#:  [id number]  DATE OF BIRTH:  02-28-1946  DATE OF ADMISSION:  03/09/2015  PRIMARY CARE PHYSICIAN: Hyman Hopes, MD   REQUESTING/REFERRING PHYSICIAN: Dr. Derrill Kay  CHIEF COMPLAINT:   Chief Complaint  Patient presents with  . Shortness of Breath     HISTORY OF PRESENT ILLNESS:  Dominique Burgess  is a 69 y.o. female with a known history of COPD, diabetes, hypertension who presented to the hospital due to shortness of breath progressively getting worse over the past week. Patient does have history of underlying COPD with ongoing tobacco abuse and at baseline is somewhat short of breath but it's gotten much worse over the past week. Patient does admit to a cough which is productive with sometimes clear and sometimes yellow sputum. No associated fever, nausea, vomiting, abdominal pain. Patient denies any recent sick contacts. Patient presented to the hospital and was noted to be hypoxic with O2 sats in the 70s and noted to be in COPD exacerbation. Hospitalist services were contacted further treatment and evaluation.  PAST MEDICAL HISTORY:   Past Medical History  Diagnosis Date  . COPD (chronic obstructive pulmonary disease)   . Diabetes mellitus without complication   . Hypertension   . Hyperlipidemia   . Dementia     PAST SURGICAL HISTORY:   Past Surgical History  Procedure Laterality Date  . Abdominal hysterectomy    . Cholecystectomy      SOCIAL HISTORY:   History  Substance Use Topics  . Smoking status: Current Every Day Smoker -- 1.00 packs/day for 50 years  . Smokeless tobacco: Never Used  . Alcohol Use: No    FAMILY HISTORY:   Family History  Problem Relation Age of Onset  . CVA Mother   . Heart attack Father     DRUG ALLERGIES:   Allergies  Allergen Reactions  . Penicillins Hives and Shortness Of Breath    REVIEW OF SYSTEMS:   Review of Systems   Constitutional: Positive for chills. Negative for fever and weight loss.  HENT: Negative for congestion, nosebleeds and tinnitus.   Eyes: Negative for blurred vision, double vision and redness.  Respiratory: Positive for cough, sputum production (clear with intermittently yellow), shortness of breath and wheezing. Negative for hemoptysis.   Cardiovascular: Negative for chest pain, orthopnea, leg swelling and PND.  Gastrointestinal: Negative for nausea, vomiting, abdominal pain, diarrhea and melena.  Genitourinary: Negative for dysuria, urgency and hematuria.  Musculoskeletal: Negative for joint pain and falls.  Skin: Negative for rash.  Neurological: Negative for dizziness, tingling, sensory change, focal weakness, seizures, weakness and headaches.  Endo/Heme/Allergies: Negative for polydipsia. Does not bruise/bleed easily.  Psychiatric/Behavioral: Negative for depression and memory loss. The patient is not nervous/anxious.     MEDICATIONS AT HOME:   Prior to Admission medications   Medication Sig Start Date End Date Taking? Authorizing Provider  albuterol (PROVENTIL HFA;VENTOLIN HFA) 108 (90 BASE) MCG/ACT inhaler Inhale 2 puffs into the lungs every 6 (six) hours as needed for wheezing or shortness of breath.   Yes Historical Provider, MD  amLODipine (NORVASC) 10 MG tablet Take 10 mg by mouth daily.   Yes Historical Provider, MD  budesonide-formoterol (SYMBICORT) 160-4.5 MCG/ACT inhaler Inhale 2 puffs into the lungs 2 (two) times daily.   Yes Historical Provider, MD  furosemide (LASIX) 40 MG tablet Take 40 mg by mouth daily.   Yes Historical Provider, MD  ipratropium (  ATROVENT HFA) 17 MCG/ACT inhaler Inhale 2 puffs into the lungs 4 (four) times daily.   Yes Historical Provider, MD  lisinopril (PRINIVIL,ZESTRIL) 20 MG tablet Take 20 mg by mouth daily.   Yes Historical Provider, MD  memantine (NAMENDA XR) 7 MG CP24 24 hr capsule Take 7 mg by mouth daily.   Yes Historical Provider, MD   metFORMIN (GLUCOPHAGE) 500 MG tablet Take 1,000 mg by mouth 2 (two) times daily.   Yes Historical Provider, MD  naproxen sodium (ANAPROX) 220 MG tablet Take 220 mg by mouth daily as needed (for pain).   Yes Historical Provider, MD  omeprazole (PRILOSEC) 20 MG capsule Take 20 mg by mouth 2 (two) times daily.   Yes Historical Provider, MD  potassium chloride (K-DUR) 10 MEQ tablet Take 10 mEq by mouth 2 (two) times daily.   Yes Historical Provider, MD  pravastatin (PRAVACHOL) 40 MG tablet Take 40 mg by mouth at bedtime.   Yes Historical Provider, MD  traZODone (DESYREL) 100 MG tablet Take 100 mg by mouth at bedtime.   Yes Historical Provider, MD      VITAL SIGNS:  Blood pressure 134/74, pulse 95, temperature 98.5 F (36.9 C), temperature source Oral, resp. rate 22, height 5\' 2"  (1.575 m), weight 94.348 kg (208 lb), SpO2 90 %.  PHYSICAL EXAMINATION:  Physical Exam  GENERAL:  69 y.o.-year-old obese female lying in the bed with no acute distress.  EYES: Pupils equal, round, reactive to light and accommodation. No scleral icterus. Extraocular muscles intact.  HEENT: Head atraumatic, normocephalic. Oropharynx and nasopharynx clear. No oropharyngeal erythema, moist oral mucosa  NECK:  Supple, no jugular venous distention. No thyroid enlargement, no tenderness.  LUNGS: Diffuse wheezing, rhonchi bilaterally. No use of accessory muscles of respiration.  CARDIOVASCULAR: S1, S2 RRR. No murmurs, rubs, gallops, clicks.  ABDOMEN: Soft, nontender, nondistended. Bowel sounds present. No organomegaly or mass.  EXTREMITIES: +1 pedal edema b/l, no cyanosis, + clubbing. + 2 pedal & radial pulses b/l.   NEUROLOGIC: Cranial nerves II through XII are intact. No focal Motor or sensory deficits appreciated b/l PSYCHIATRIC: The patient is alert and oriented x 3. Good affect.  SKIN: No obvious rash, lesion, or ulcer.   LABORATORY PANEL:   CBC  Recent Labs Lab 03/09/15 1309  WBC 9.6  HGB 11.9*  HCT 37.1   PLT 278   ------------------------------------------------------------------------------------------------------------------  Chemistries   Recent Labs Lab 03/09/15 1309  NA 135  K 3.4*  CL 93*  CO2 28  GLUCOSE 152*  BUN 6  CREATININE 0.82  CALCIUM 7.7*   ------------------------------------------------------------------------------------------------------------------  Cardiac Enzymes  Recent Labs Lab 03/09/15 1309  TROPONINI <0.03   ------------------------------------------------------------------------------------------------------------------  RADIOLOGY:  Dg Chest 2 View  03/09/2015   CLINICAL DATA:  69 year old female with 2 week history of shortness of breath. Medical history includes COPD and CHF.  EXAM: CHEST  2 VIEW  COMPARISON:  Prior chest x-ray 05/28/2014  FINDINGS: Stable cardiomegaly and mediastinal contours. Obscuration of the left hemidiaphragm secondary to a small left pleural effusion and associated atelectasis and/ or infiltrate. Mild pulmonary vascular congestion without overt edema. No suspicious pulmonary nodule or mass. No pneumothorax. No acute osseous abnormality. Suspect trace right pleural effusion as well.  IMPRESSION: 1. Small left pleural effusion with associated atelectasis. Infiltrate is difficult to exclude radiographically. 2. Suspect trace right pleural effusion as well. 3. Pulmonary vascular congestion without overt edema.   Electronically Signed   By: Malachy MoanHeath  McCullough M.D.   On: 03/09/2015 14:05  IMPRESSION AND PLAN:   69 year old female with past medical history of diabetes, hypertension, dementia, COPD with ongoing tobacco abuse who presented to the hospital due to shortness of breath and wheezing and noted to be in COPD exacerbation.  #1 acute COPD exacerbation-this is likely the cause of patient's shortness of breath and hypoxemia. -This is likely secondary to acute bronchitis, ongoing tobacco abuse. -We will give IV steroids,  around-the-clock DuoNeb's, continue Symbicort, add Spiriva. -Chest x-ray is negative for acute pneumonia but we'll empirically treated with IV Levaquin. -Patient needs to be assessed for home oxygen prior to discharge.   2. Diabetes type 2-patient is blood sugars are going to be slightly uncontrolled due to the IV steroids. -I will continue her metformin, add sliding scale insulin. We'll check hemoglobin A1c.  #3 hypertension-continue lisinopril.  #4 history of congestive heart failure-chronic diastolic CHF. -Continue Lasix continue lisinopril.  #5 dementia-continue Namenda.  #6 Hyperlipidemia - cont. Pravachol.    All the records are reviewed and case discussed with ED provider. Management plans discussed with the patient, family and they are in agreement.  CODE STATUS: Full  TOTAL TIME TAKING CARE OF THIS PATIENT: 50 minutes.    Houston Siren M.D on 03/09/2015 at 4:49 PM  Between 7am to 6pm - Pager - [phone]  After 6pm go to [email or web address] - password [email or web address] [facility]  [place] [facility]  Office  [phone]  CC: Primary care physician; Hyman Hopes, MD

## 2015-03-09 NOTE — ED Notes (Signed)
3x duoneb administered by RT at 1310

## 2015-03-10 ENCOUNTER — Inpatient Hospital Stay (HOSPITAL_COMMUNITY)
Admit: 2015-03-10 | Discharge: 2015-03-10 | Disposition: A | Discharge status: Home / Self Care | Payer: Medicare Other | Attending: Internal Medicine | Admitting: Internal Medicine

## 2015-03-10 DIAGNOSIS — I5031 Acute diastolic (congestive) heart failure: Secondary | ICD-10-CM

## 2015-03-10 LAB — GLUCOSE, CAPILLARY
Glucose-Capillary: 271 mg/dL — ABNORMAL HIGH (ref 65–99)
Glucose-Capillary: 213 mg/dL — ABNORMAL HIGH (ref 65–99)
Glucose-Capillary: 181 mg/dL — ABNORMAL HIGH (ref 65–99)
Glucose-Capillary: 209 mg/dL — ABNORMAL HIGH (ref 65–99)

## 2015-03-10 LAB — CBC
HCT: 35.2 % (ref 35.0–47.0)
Hemoglobin: 11.5 g/dL — ABNORMAL LOW (ref 12.0–16.0)
MCH: 26.7 pg (ref 26.0–34.0)
MCHC: 32.6 g/dL (ref 32.0–36.0)
MCV: 82.2 fL (ref 80.0–100.0)
Platelets: 251 10*3/uL (ref 150–440)
RBC: 4.28 MIL/uL (ref 3.80–5.20)
RDW: 17.4 % — ABNORMAL HIGH (ref 11.5–14.5)
WBC: 7.9 10*3/uL (ref 3.6–11.0)

## 2015-03-10 LAB — BASIC METABOLIC PANEL
Anion gap: 13 (ref 5–15)
BUN: 13 mg/dL (ref 6–20)
CO2: 28 mmol/L (ref 22–32)
Calcium: 7.3 mg/dL — ABNORMAL LOW (ref 8.9–10.3)
Chloride: 91 mmol/L — ABNORMAL LOW (ref 101–111)
Creatinine, Ser: 0.95 mg/dL (ref 0.44–1.00)
GFR calc Af Amer: >60 mL/min (ref >60)
GFR calc non Af Amer: >60 mL/min (ref >60)
Glucose, Bld: 197 mg/dL — ABNORMAL HIGH (ref 65–99)
Potassium: 4 mmol/L (ref 3.5–5.1)
Sodium: 132 mmol/L — ABNORMAL LOW (ref 135–145)

## 2015-03-10 LAB — EXPECTORATED SPUTUM ASSESSMENT W GRAM STAIN, RFLX TO RESP C

## 2015-03-10 LAB — HEMOGLOBIN A1C: Hgb A1c MFr Bld: 7.1 % — ABNORMAL HIGH (ref 4.0–6.0)

## 2015-03-10 MED ORDER — NICOTINE 10 MG IN INHA
1.0000 | RESPIRATORY_TRACT | Status: DC | PRN
  Filled 2015-03-10: qty 36

## 2015-03-10 MED ORDER — METHYLPREDNISOLONE SODIUM SUCC 40 MG IJ SOLR
40.0000 mg | Freq: Two times a day (BID) | INTRAMUSCULAR | Status: DC
  Administered 2015-03-11 – 2015-03-12 (×4): 40 mg via INTRAVENOUS
  Filled 2015-03-10 (×4): qty 1

## 2015-03-10 MED ORDER — GUAIFENESIN-CODEINE 100-10 MG/5ML PO SOLN
10.0000 mL | ORAL | Status: DC | PRN
  Administered 2015-03-11 – 2015-03-13 (×2): 10 mL via ORAL
  Filled 2015-03-10 (×2): qty 10

## 2015-03-10 MED ORDER — NICOTINE 14 MG/24HR TD PT24
14.0000 mg | MEDICATED_PATCH | Freq: Every day | TRANSDERMAL | Status: DC
  Administered 2015-03-10 – 2015-03-14 (×5): 14 mg via TRANSDERMAL
  Filled 2015-03-10 (×5): qty 1

## 2015-03-10 MED ORDER — LEVOFLOXACIN 750 MG PO TABS
750.0000 mg | ORAL_TABLET | Freq: Every day | ORAL | Status: DC
  Administered 2015-03-10 – 2015-03-13 (×4): 750 mg via ORAL
  Filled 2015-03-10 (×4): qty 1

## 2015-03-10 MED ORDER — SODIUM CHLORIDE 0.9 % IJ SOLN
3.0000 mL | Freq: Two times a day (BID) | INTRAMUSCULAR | Status: DC
  Administered 2015-03-11 – 2015-03-14 (×7): 3 mL via INTRAVENOUS

## 2015-03-10 MED ORDER — SODIUM CHLORIDE 0.9 % IJ SOLN: 3.0000 mL | INTRAMUSCULAR | Status: DC | PRN

## 2015-03-10 MED ORDER — ALUM & MAG HYDROXIDE-SIMETH 200-200-20 MG/5ML PO SUSP
15.0000 mL | Freq: Four times a day (QID) | ORAL | Status: DC | PRN
  Administered 2015-03-10: 15 mL via ORAL
  Filled 2015-03-10: qty 30

## 2015-03-10 NOTE — Progress Notes (Signed)
[facility] Physicians - [place] at [facility]   PATIENT NAME: Dominique Burgess    MR#:  [id number]  DATE OF BIRTH:  12/26/1945  SUBJECTIVE:  CHIEF COMPLAINT:   Chief Complaint  Patient presents with  . Shortness of Breath   patient presents with shortness of breath as well as lower extremity swelling and cough with gray thick-looking phlegm. Chest x-ray done in the emergency room revealed congestive heart failure. Patient was admitted to the hospital with diagnosis of acute bronchitis as well as CHF. She is on Lasix orally. She states that she feels comfortable now when she is on oxygen therapy per nasal cannulas. Initial oxygen saturations were in 70s. Patient's daughter is at bedside  Review of Systems  Constitutional: Negative for fever, chills and weight loss.  HENT: Negative for congestion.   Eyes: Negative for blurred vision and double vision.  Respiratory: Positive for cough and shortness of breath. Negative for sputum production and wheezing.   Cardiovascular: Positive for orthopnea, leg swelling and PND. Negative for chest pain and palpitations.  Gastrointestinal: Negative for nausea, vomiting, abdominal pain, diarrhea, constipation and blood in stool.  Genitourinary: Negative for dysuria, urgency, frequency and hematuria.  Musculoskeletal: Negative for falls.  Neurological: Negative for dizziness, tremors, focal weakness and headaches.  Endo/Heme/Allergies: Does not bruise/bleed easily.  Psychiatric/Behavioral: Negative for depression. The patient does not have insomnia.     VITAL SIGNS: Blood pressure 123/56, pulse 104, temperature 98.3 F (36.8 C), temperature source Oral, resp. rate 20, height  (1.575 m), weight 96.248 kg (212 lb 3 oz), SpO2 94 %.  PHYSICAL EXAMINATION:   GENERAL:  69 y.o.-year-old patient lying in the bed in moderate respiratory distress. She is asleep. However, she has significant disconcordant movements of her chest as well as abdomen,  and there is no air movement in her lungs despite violent chest movements, she is snoring. Upon awakening, she is alert and able to sit up. Communicates with ease.  EYES: Pupils equal, round, reactive to light and accommodation. No scleral icterus. Extraocular muscles intact.  HEENT: Head atraumatic, normocephalic. Oropharynx and nasopharynx clear.  NECK:  Supple, no jugular venous distention. No thyroid enlargement, no tenderness.  LUNGS: Normal breath sounds bilaterally, no wheezing, but rales and crepitations noted bilaterally posteriorly at the bases, mostly on the left side. No use of accessory muscles of respiration when alert, however, significant struggling , using accessory muscles when she is asleep.  CARDIOVASCULAR: S1, S2 normal. No murmurs, rubs, or gallops.  ABDOMEN: Soft, nontender, nondistended. Bowel sounds present. No organomegaly or mass.  EXTREMITIES: No pedal edema, cyanosis, or clubbing. 1+ lower extremity edema NEUROLOGIC: Cranial nerves II through XII are intact. Muscle strength 5/5 in all extremities. Sensation intact. Gait not checked.  PSYCHIATRIC: The patient is alert and oriented x 3.  SKIN: No obvious rash, lesion, or ulcer.   ORDERS/RESULTS REVIEWED:   CBC  Recent Labs Lab 03/09/15 1309 03/10/15 0433  WBC 9.6 7.9  HGB 11.9* 11.5*  HCT 37.1 35.2  PLT 278 251  MCV 81.8 82.2  MCH 26.3 26.7  MCHC 32.2 32.6  RDW 17.3* 17.4*  LYMPHSABS 1.4  --   MONOABS 0.4  --   EOSABS 0.1  --   BASOSABS 0.0  --    ------------------------------------------------------------------------------------------------------------------  Chemistries   Recent Labs Lab 03/09/15 1309 03/10/15 0433  NA 135 132*  K 3.4* 4.0  CL 93* 91*  CO2 28 28  GLUCOSE 152* 197*  BUN 6 13  CREATININE 0.82 0.95  CALCIUM 7.7* 7.3*   ------------------------------------------------------------------------------------------------------------------ estimated creatinine clearance is 62.1  mL/min (by C-G formula based on Cr of 0.95). ------------------------------------------------------------------------------------------------------------------ No results for input(s): TSH, T4TOTAL, T3FREE, THYROIDAB in the last 72 hours.  Invalid input(s): FREET3  Cardiac Enzymes  Recent Labs Lab 03/09/15 1309  TROPONINI <0.03   ------------------------------------------------------------------------------------------------------------------ Invalid input(s): POCBNP ---------------------------------------------------------------------------------------------------------------  RADIOLOGY: Dg Chest 2 View  03/09/2015   CLINICAL DATA:  69 year old female with 2 week history of shortness of breath. Medical history includes COPD and CHF.  EXAM: CHEST  2 VIEW  COMPARISON:  Prior chest x-ray 05/28/2014  FINDINGS: Stable cardiomegaly and mediastinal contours. Obscuration of the left hemidiaphragm secondary to a small left pleural effusion and associated atelectasis and/ or infiltrate. Mild pulmonary vascular congestion without overt edema. No suspicious pulmonary nodule or mass. No pneumothorax. No acute osseous abnormality. Suspect trace right pleural effusion as well.  IMPRESSION: 1. Small left pleural effusion with associated atelectasis. Infiltrate is difficult to exclude radiographically. 2. Suspect trace right pleural effusion as well. 3. Pulmonary vascular congestion without overt edema.   Electronically Signed   By: Malachy Moan M.D.   On: 03/09/2015 14:05    EKG:  Orders placed or performed during the hospital encounter of 03/09/15  . EKG 12-Lead  . EKG 12-Lead    ASSESSMENT AND PLAN:  Active Problems:   COPD exacerbation 1. Acute respiratory failure due to acute bronchitis as well as acute, likely diastolic CHF as well as non-diagnosed obstructive sleep apnea, continue oxygen therapy. Continue antibiotics with levofloxacin orally. Get sputum cultures. Arrange pulse oximeter at  nighttime to rule out apnea episodes, patient will need sleep study as outpatient. Get echocardiogram 2. Acute bronchitis as above. Get sputum cultures. Continue antibiotic therapy with levofloxacin 3. Acute diastolic congestive heart failure. Continue patient on diuretics, following in's and outs as well as patient's weight. Getting echocardiogram read by Dr.Arida to rule out valvular disease as well as cardiomyopathy 4. Questionable COPD exacerbation, more likely CHF, since resolution is quick. We'll continue Lasix therapy for now 5. Suspected obstructive sleep apnea. Patient needs sleep study as outpatient. Discussed with patient and family extensively 6. Tobacco abuse counseling. Discussed this patient for for approximately 5 minutes. Nicotine replacement therapy will be initiated for which she is agreeable 7. Obesity, patient's hemoglobin A1c was found to be 7.1, thus she has diabetes mellitus. Will get diabetic education and will initiate patient on sliding scale insulin   Management plans discussed with the patient, family and they are in agreement.   DRUG ALLERGIES:  Allergies  Allergen Reactions  . Penicillins Hives and Shortness Of Breath    CODE STATUS:     Code Status Orders        Start     Ordered   03/09/15 1747  Full code   Continuous     03/09/15 1746      TOTAL TIME TAKING CARE OF THIS PATIENT: 45 minutes.  Prolonged discussion this patient as well as her daughter about her respiratory failure issues including likely obstructive sleep apnea as well as congestive heart failure, diabetic medication was not provided to this time. Yet, but tobacco abuse counseling was provided, time spent approximately 15-20 minutes before discussions with family and further planning of her evaluation  Lilyrose Tanney M.D on 03/10/2015 at 11:52 AM  Between 7am to 6pm - Pager - [phone]  After 6pm go to [email or web address] - Scientist, research (life sciences) Hospitalists  Office   [phone]  CC: Primary care physician; Hyman HopesBurns, Harriett P, MD

## 2015-03-10 NOTE — Progress Notes (Signed)
No sputum at this time

## 2015-03-10 NOTE — Progress Notes (Signed)
PHARMACIST - PHYSICIAN COMMUNICATION  CONCERNING: Antibiotic IV to Oral Route Change Policy  RECOMMENDATION: This patient is receiving levofloxacin by the intravenous route.  Based on criteria approved by the Pharmacy and Therapeutics Committee, the antibiotic(s) is/are being converted to the equivalent oral dose form(s).   DESCRIPTION: These criteria include:  Patient being treated for a respiratory tract infection, urinary tract infection, cellulitis or clostridium difficile associated diarrhea if on metronidazole  The patient is not neutropenic and does not exhibit a GI malabsorption state  The patient is eating (either orally or via tube) and/or has been taking other orally administered medications  The patient is improving clinically and has a Tmax < 100.5  If you have questions about this conversion, please contact the Pharmacy Department  []   [phone]( [phone] )  [facility] []   [phone]( [phone] )  [facility] []   [phone]( [phone] )  [facility] []   [phone]( [phone] )  [facility] []   [phone]( [phone] )  [facility]    Clarisa Schoolsrystal Kasyn Stouffer, PharmD Clinical Pharmacist 03/10/2015

## 2015-03-10 NOTE — Plan of Care (Signed)
Problem: Discharge Progression Outcomes Goal: Other Discharge Outcomes/Goals Plan of care progress to goal:  Patient continues to complain of pain in the upper abdomen - radiating towards the back, relieved with Morphine. C/o nausea x1 - antiemetic given with relief. Patient remains on 4L-O2, sat 97%. Dyspnea on exertion continues. [facility]. Sputum culture collected. 2D echo performed - results pending. Daughter at bedside.   Patient placed on continuous pulse ox monitoring per Dr. Winona LegatoVaickute.

## 2015-03-10 NOTE — Progress Notes (Signed)
Inpatient Diabetes Program Recommendations  AACE/ADA: New Consensus Statement on Inpatient Glycemic Control (2013)  Target Ranges:  Prepandial:   less than 140 mg/dL      Peak postprandial:   less than 180 mg/dL (1-2 hours)      Critically ill patients:  140 - 180 mg/dL   Met with patient at the bedside; she has had diabetes for "years"; checks her blood sugars 2x/day, drinks only diet drinks, sees MD on a regular basis and takes meds as ordered.  She was unsure of the test, "A1C" but I explained the results and wrote it down to share with her MD.  Encouraged patient to continue current diabetes practices.  Gentry Fitz, RN, BA, MHA, CDE Diabetes Coordinator Inpatient Diabetes Program  [phone] (Team Pager) [phone] ([place]) 03/10/2015 3:25 PM

## 2015-03-10 NOTE — Progress Notes (Signed)
TC to Dr. Clint GuyHower with pt requesting cough syrup for frequent dry,nonproductive cough. Advised he will place the order.

## 2015-03-10 NOTE — Plan of Care (Signed)
Problem: Discharge Progression Outcomes Goal: Discharge plan in place and appropriate Individualization Outcome: Progressing Pt accompanied by daughter, Darla.   Has a hx of COPD, DM, HTN, CHF.  Continues to smoke at home. Does not use O2 at home. Stress incontinence. Goal: Other Discharge Outcomes/Goals Outcome: Progressing Plan of care progress to goal: Pain - pt complains of pain in upper abdomen and back, relieved with morphine. Diet - tolerating.  Appetite good. Activity - pt has generalized weakness, [facility] Hemodynamically stable - vitals stable, work of breathing appears to be labored with exertion, but sats are >92% on 4L O2

## 2015-03-10 NOTE — Progress Notes (Signed)
TC from telemetry reporting pulse ox dropped to 85-89%. Pt was up on BSC at that time; returned to bed with recheck done with pulse ox 94 % on 02 @ 4l/[place] with HR 101. Pt reports no respiratory distress. Telemetry notified with pulse ox now 98% on 02.

## 2015-03-10 NOTE — Progress Notes (Signed)
*  PRELIMINARY RESULTS* Echocardiogram 2D Echocardiogram has been performed.  Dominique HousekeeperJerry R Burgess 03/10/2015, 2:25 PM

## 2015-03-10 NOTE — Progress Notes (Signed)
Inpatient Diabetes Program Recommendations  AACE/ADA: New Consensus Statement on Inpatient Glycemic Control (2013)  Target Ranges:  Prepandial:   less than 140 mg/dL      Peak postprandial:   less than 180 mg/dL (1-2 hours)      Critically ill patients:  140 - 180 mg/dL    Results for Dominique Burgess, Dominique Burgess (MRN [id number]) as of 03/10/2015 08:49  Ref. Range 03/09/2015 18:58 03/09/2015 21:50 03/10/2015 08:30  Glucose-Capillary Latest Ref Range: 65-99 mg/dL 191235 (H) 478318 (H) 295271 (H)    Reason for assessment: elevated CBG  Diabetes history: Type 2 Outpatient Diabetes medications: Glucophage 1000mg  bid Current orders for Inpatient glycemic control: Novolog correction moderate correction 0-15 units tid with meals and 0-5 units qhs, Metformin 1000mg  bid  Please consider increasing Novolog correction to resistant scale 0-20 units tid with meal (steroids)  Consider adding Lantus 10 units (0.1units/kg) q day as fasting CBG 271mg /dl  Susette RacerJulie Graham Doukas, RN, BA, MHA, CDE Diabetes Coordinator Inpatient Diabetes Program  [phone] (Team Pager) [phone] [facility] Office) 03/10/2015 8:52 AM

## 2015-03-11 LAB — GLUCOSE, CAPILLARY
Glucose-Capillary: 221 mg/dL — ABNORMAL HIGH (ref 65–99)
Glucose-Capillary: 279 mg/dL — ABNORMAL HIGH (ref 65–99)
Glucose-Capillary: 160 mg/dL — ABNORMAL HIGH (ref 65–99)
Glucose-Capillary: 219 mg/dL — ABNORMAL HIGH (ref 65–99)

## 2015-03-11 MED ORDER — POLYETHYLENE GLYCOL 3350 17 G PO PACK
17.0000 g | PACK | Freq: Every day | ORAL | Status: DC
  Administered 2015-03-11 – 2015-03-14 (×4): 17 g via ORAL
  Filled 2015-03-11 (×4): qty 1

## 2015-03-11 MED ORDER — INSULIN GLARGINE 100 UNIT/ML ~~LOC~~ SOLN
10.0000 [IU] | Freq: Every day | SUBCUTANEOUS | Status: DC
  Administered 2015-03-11 – 2015-03-13 (×3): 10 [IU] via SUBCUTANEOUS
  Filled 2015-03-11 (×4): qty 0.1

## 2015-03-11 NOTE — Care Management (Signed)
Physical therapy evaluation completed. Recommends home with home health. Spoke with Ms. Velis and daughter, Earlean ShawlDarla, at the bedside. Chose [facility]. States they have had  [facility] 2 years ago and was Pleased with their services. Gwenette GreetBrenda S Holland RN MSN Care Management [phone]

## 2015-03-11 NOTE — Progress Notes (Signed)
[facility] - [place] at [facility]   PATIENT NAME: Dominique Burgess    MR#:  [id number]  DATE OF BIRTH:  1946-09-09  SUBJECTIVE:  CHIEF COMPLAINT:   Chief Complaint  Patient presents with  . Shortness of Breath   Still feels short of breath, but improved.  Feels  A bit better since admission.     Review of Systems  Constitutional: Negative for fever, chills and weight loss.  HENT: Negative for congestion.   Eyes: Negative for blurred vision and double vision.  Respiratory: Positive for cough, shortness of breath and wheezing. Negative for sputum production.   Cardiovascular: Positive for leg swelling and PND. Negative for chest pain, palpitations and orthopnea.  Gastrointestinal: Negative for nausea, vomiting, abdominal pain, diarrhea, constipation and blood in stool.  Genitourinary: Negative for dysuria, urgency, frequency and hematuria.  Musculoskeletal: Negative for falls.  Neurological: Negative for dizziness, tremors, focal weakness and headaches.  Endo/Heme/Allergies: Does not bruise/bleed easily.  Psychiatric/Behavioral: Negative for depression. The patient does not have insomnia.     VITAL SIGNS: Blood pressure 132/54, pulse 94, temperature 98.4 F (36.9 C), temperature source Oral, resp. rate 20, height  (1.575 m), weight 96.248 kg (212 lb 3 oz), SpO2 92 %.  PHYSICAL EXAMINATION:   GENERAL:  69 y.o.-year-old obese patient lying in the bed in moderate respiratory distress.   EYES: Pupils equal, round, reactive to light and accommodation. No scleral icterus. Extraocular muscles intact.  HEENT: Head atraumatic, normocephalic. Oropharynx and nasopharynx clear.  NECK:  Supple, no jugular venous distention. No thyroid enlargement, no tenderness.  LUNGS: Diffuse wheezing bilaterally. Positive use of accessory muscles. No dullness to percussion. No rales or rhonchi.  CARDIOVASCULAR: S1, S2 normal. No murmurs, rubs, or gallops.  ABDOMEN: Soft,  nontender, nondistended. Bowel sounds present. No organomegaly or mass.  EXTREMITIES: No cyanosis + clubbing. 1+ lower extremity edema NEUROLOGIC: Cranial nerves II through XII are intact. No focal motor or sensory deficits appreciated bilaterally. Globally weak. PSYCHIATRIC: The patient is alert and oriented x 3. Good affect.  SKIN: No obvious rash, lesion, or ulcer.   ORDERS/RESULTS REVIEWED:   CBC  Recent Labs Lab 03/09/15 1309 03/10/15 0433  WBC 9.6 7.9  HGB 11.9* 11.5*  HCT 37.1 35.2  PLT 278 251  MCV 81.8 82.2  MCH 26.3 26.7  MCHC 32.2 32.6  RDW 17.3* 17.4*  LYMPHSABS 1.4  --   MONOABS 0.4  --   EOSABS 0.1  --   BASOSABS 0.0  --    ------------------------------------------------------------------------------------------------------------------  Chemistries   Recent Labs Lab 03/09/15 1309 03/10/15 0433  NA 135 132*  K 3.4* 4.0  CL 93* 91*  CO2 28 28  GLUCOSE 152* 197*  BUN 6 13  CREATININE 0.82 0.95  CALCIUM 7.7* 7.3*   ------------------------------------------------------------------------------------------------------------------ estimated creatinine clearance is 62.1 mL/min (by C-G formula based on Cr of 0.95). ------------------------------------------------------------------------------------------------------------------ No results for input(s): TSH, T4TOTAL, T3FREE, THYROIDAB in the last 72 hours.  Invalid input(s): FREET3  Cardiac Enzymes  Recent Labs Lab 03/09/15 1309  TROPONINI <0.03   ------------------------------------------------------------------------------------------------------------------ Invalid input(s): POCBNP ---------------------------------------------------------------------------------------------------------------  RADIOLOGY: Dg Chest 2 View  03/09/2015   CLINICAL DATA:  69 year old female with 2 week history of shortness of breath. Medical history includes COPD and CHF.  EXAM: CHEST  2 VIEW  COMPARISON:  Prior chest  x-ray 05/28/2014  FINDINGS: Stable cardiomegaly and mediastinal contours. Obscuration of the left hemidiaphragm secondary to a small left pleural effusion and associated atelectasis and/ or infiltrate.  Mild pulmonary vascular congestion without overt edema. No suspicious pulmonary nodule or mass. No pneumothorax. No acute osseous abnormality. Suspect trace right pleural effusion as well.  IMPRESSION: 1. Small left pleural effusion with associated atelectasis. Infiltrate is difficult to exclude radiographically. 2. Suspect trace right pleural effusion as well. 3. Pulmonary vascular congestion without overt edema.   Electronically Signed   By: Malachy MoanHeath  McCullough M.D.   On: 03/09/2015 14:05    ASSESSMENT AND PLAN:  69 year old female with past medical history of diabetes, hypertension, dementia, COPD with ongoing tobacco abuse who presented to the hospital due to shortness of breath and wheezing and noted to be in COPD exacerbation.   1. Acute respiratory failure - likely multifactorial. Combination of COPD exacerbation/CHF/obstructive sleep apnea. -Slightly improved. Continue IV steroids, Lasix, await overnight oximetry. She likely needs outpatient sleep study. Patient also likely will need to be discharged home oxygen.  2. Acute bronchitis - improving-await sputum cultures. Continue Levaquin.  3. Acute diastolic congestive heart failure-improved with diuresis. -Continue Lasix, follow I's and O's and daily weights. -Echocardiogram done showing normal ejection fraction of 60%.  4. COPD exacerbation-continue IV steroids, around-the-clock nebs, Spiriva, Symbicort. -Assess for home oxygen prior to discharge.  5. Suspected obstructive sleep apnea. Patient needs sleep study as outpatient.   6. Obesity, patient's hemoglobin A1c was found to be 7.1, thus she has diabetes mellitus.  -Continue sliding scale insulin, appreciate diabetic education and will start low-dose Lantus.  We'll get physical therapy  evaluation this patient is quite deconditioned. Patient will likely need short-term rehabilitation and case management has been notified.   DRUG ALLERGIES:  Allergies  Allergen Reactions  . Penicillins Hives and Shortness Of Breath    CODE STATUS:     Code Status Orders        Start     Ordered   03/09/15 1747  Full code   Continuous     03/09/15 1746      TOTAL TIME TAKING CARE OF THIS PATIENT: 30 minutes.   Greater than 50% of time spent in coordination of care.   Houston SirenSAINANI,Kem Parcher J M.D on 03/11/2015 at 12:50 PM  Between 7am to 6pm - Pager - [phone]  After 6pm go to [email or web address] - password [email or web address] [facility]  [place] [place] [facility]  Office  [phone]  CC: Primary care physician; Hyman HopesBurns, Harriett P, MD

## 2015-03-11 NOTE — Progress Notes (Signed)
Oxy ear covers placed on nasal cannula to help with ear discomfort from nasal cannula

## 2015-03-11 NOTE — Progress Notes (Signed)
Initial Nutrition Assessment  INTERVENTION:  Meals and Snacks: Cater to patient preferences; will adjust diet order to be carbohydrate modified as well as heart healthy  Education: RD consulted for nutrition education regarding diabetes.   RD provided "Carbohydrate Counting for People with Diabetes" handout from the Academy of Nutrition and Dietetics. Discussed different food groups and their effects on blood sugar, emphasizing carbohydrate-containing foods. Provided list of carbohydrates and recommended serving sizes of common foods.  Discussed importance of controlled and consistent carbohydrate intake throughout the day. Provided examples of ways to balance meals/snacks and encouraged intake of high-fiber, whole grain complex carbohydrates. Teach back method used.  Expect good compliance.  NUTRITION DIAGNOSIS:  Food and nutrition related knowledge deficit related to chronic illness as evidenced by  (Diet education consult, elevated HbA1c).  GOAL:  Patient will meet greater than or equal to 90% of their needs  MONITOR:   (Energy Intake, Glucose Profile)  REASON FOR ASSESSMENT:  Consult Diet education  ASSESSMENT:  Pt admitted with SOB and COPD exacerbation.  PMHx:  Past Medical History  Diagnosis Date  . COPD (chronic obstructive pulmonary disease)   . Diabetes mellitus without complication   . Hypertension   . Hyperlipidemia   . Dementia   . CHF (congestive heart failure)     Diet Order:  Diet heart healthy/carb modified Room service appropriate?: Yes; Fluid consistency:: Thin  Current Nutrition: Pt reports eating breakfast this am. Per I/O chart pt eating 70-100% of meals since admission.   Food/Nutrition-Related History: Pt reports trying to watch intake of simple carbohydrates such as cakes, cookies, ice cream. Pt reports only drinking diet sodas.     Medications: Metformin, Protonix, KCl, Solumderol, Novolog, Lasix  Electrolyte/Renal Profile and Glucose  Profile:   Recent Labs Lab 03/09/15 1309 03/10/15 0433  NA 135 132*  K 3.4* 4.0  CL 93* 91*  CO2 28 28  BUN 6 13  CREATININE 0.82 0.95  CALCIUM 7.7* 7.3*  GLUCOSE 152* 197*   Protein Profile: No results for input(s): ALBUMIN in the last 168 hours.  Gastrointestinal Profile: Last BM: 6/29   Weight Change: Per MST pt without decreased weight PTA Anthropometrics:   Height:  Ht Readings from Last 1 Encounters:  03/09/15 5\' 2"  (1.575 m)    Weight:  Wt Readings from Last 1 Encounters:  03/09/15 212 lb 3 oz (96.248 kg)     Wt Readings from Last 10 Encounters:  03/09/15 212 lb 3 oz (96.248 kg)    BMI:  Body mass index is 38.8 kg/(m^2).  Skin:  Reviewed, no issues  EDUCATION NEEDS:  Education needs addressed  LOW Care Level  Dominique QuailAllyson Aniston Burgess, IowaRD, LDN Pager [phone]

## 2015-03-11 NOTE — Care Management (Signed)
Important Message  Patient Details  Name: Dominique Burgess MRN: [id number] Date of Birth: 1946/02/06   Medicare Important Message Given:  Yes-second notification given    Olegario MessierKathy A Allmond 03/11/2015, 10:05 AM

## 2015-03-11 NOTE — Evaluation (Signed)
Physical Therapy Evaluation Patient Details Name: Dominique Burgess MRN: [id number] DOB: 01-Nov-1945 Today's Date: 03/11/2015   History of Present Illness  Pt is a 69 y.o. female presenting with increased SOB over the past week.  Pt admitted with COPD exacerbation, acute bronchitis, and CHF.  Pt also has been c/o upper abdomen pain during hospital stay.  PMH including:  COPD, DM, htn, dementia.  Clinical Impression  Currently pt demonstrates impairments with activity tolerance d/t SOB, balance, overall strength, and limitations with functional mobility.  Prior to admission, pt was independent without AD.  Pt lives with her daughter in a mobile home with 7-8 steps to enter with B railing.  Currently pt is CGA with transfers and ambulation with RW; distance limited d/t SOB (pt was not on home O2 prior to admission).  Pt would benefit from skilled PT to address above noted impairments and functional limitations.  Recommend pt discharge to home with HHPT, use of RW, and assist for all mobility for safety (pt's daughter reports she can provide this and she and her son can assist pt on stairs to enter/exit home) when medically appropriate.     Follow Up Recommendations Home health PT;Supervision for mobility/OOB (assist for stairs)    Equipment Recommendations  Rolling walker with 5" wheels;3in1 (PT) (shower chair)    Recommendations for Other Services       Precautions / Restrictions Precautions Precautions: Fall Restrictions Weight Bearing Restrictions: No      Mobility  Bed Mobility Overal bed mobility: Modified Independent             General bed mobility comments: Sit to supine with HOB elevated  Transfers Overall transfer level: Needs assistance Equipment used: Rolling walker (2 wheeled) Transfers: Sit to/from Stand Sit to Stand: Min guard            Ambulation/Gait Ambulation/Gait assistance: Min guard Ambulation Distance (Feet): 40 Feet Assistive device: Rolling walker  (2 wheeled)       General Gait Details: decreased cadence; decreased B step length and foot clearance; pt stopped for standing rest break after 20 feet and then 35 feet d/t SOB; limited distance d/t SOB.  Stairs            Wheelchair Mobility    Modified Rankin (Stroke Patients Only)       Balance Overall balance assessment: Needs assistance Sitting-balance support: No upper extremity supported Sitting balance-Leahy Scale: Good     Standing balance support: Bilateral upper extremity supported Standing balance-Leahy Scale: Good                               Pertinent Vitals/Pain Pain Assessment: 0-10 Pain Score: 6  Pain Location: R foot (pt reports having this pain since fall about 6 months ago (x-ray negative for fracture at that time per pt and pt's daughter)) Pain Descriptors / Indicators: Sore Pain Intervention(s): Limited activity within patient's tolerance;Monitored during session  O2>91% on 4L/min via nasal cannula during session. HR 96-104 bpm during session.    Home Living Family/patient expects to be discharged to:: Private residence Living Arrangements: Children (Pt's daughter) Available Help at Discharge: Family;Available 24 hours/day Type of Home: Mobile home Home Access: Stairs to enter Entrance Stairs-Rails: Right;Left;Can reach both Entrance Stairs-Number of Steps: 7-8 Home Layout: One level Home Equipment: Walker - standard      Prior Function Level of Independence: Independent  Hand Dominance        Extremity/Trunk Assessment   Upper Extremity Assessment: Generalized weakness           Lower Extremity Assessment: Generalized weakness         Communication   Communication:  (limited d/t SOB with speaking)  Cognition Arousal/Alertness: Awake/alert Behavior During Therapy: WFL for tasks assessed/performed Overall Cognitive Status: Within Functional Limits for tasks assessed                       General Comments  Nursing cleared pt for participation in physical therapy.  Pt agreeable to PT session.  Pt's daughter present entire session.    Exercises        Assessment/Plan    PT Assessment Patient needs continued PT services  PT Diagnosis Difficulty walking   PT Problem List Decreased strength;Decreased activity tolerance;Decreased balance;Decreased mobility;Cardiopulmonary status limiting activity  PT Treatment Interventions DME instruction;Gait training;Stair training;Functional mobility training;Therapeutic activities;Therapeutic exercise;Balance training;Patient/family education   PT Goals (Current goals can be found in the Care Plan section) Acute Rehab PT Goals Patient Stated Goal: To go home PT Goal Formulation: With patient/family Time For Goal Achievement: 03/25/15 Potential to Achieve Goals: Good    Frequency Min 2X/week   Barriers to discharge        Co-evaluation               End of Session Equipment Utilized During Treatment: Gait belt;Oxygen (4 L/min via nasal cannula) Activity Tolerance: Other (comment) (Limited d/t SOB) Patient left: in bed;with call bell/phone within reach;with bed alarm set;with family/visitor present (Pt's daughter present)           Time: [id number] PT Time Calculation (min) (ACUTE ONLY): 35 min   Charges:   PT Evaluation $Initial PT Evaluation Tier I: 1 Procedure     PT G CodesHendricks Limes:        Dominique Burgess 03/11/2015, 5:10 PM Hendricks LimesEmily Tiaja Hagan, PT [phone]

## 2015-03-11 NOTE — Clinical Social Work Note (Signed)
CSW was consulted for New SNF placement. PT is recommending home with home health. RNCM is aware and following. FL2 is complete in the event pt needs SNF. CSW is signing off as no further needs identified. Please reconsult if a need arises prior to discharge.   Truddie HiddenSarah McNutly, MSW, LCSW Clinical Social Worker  [phone]

## 2015-03-11 NOTE — Care Management (Signed)
Admitted to [facility] with the diagnosis of COPD. Lives with daughter, Earlean ShawlDarla [phone]). Sees Dr. Lawerance BachBurns. Last seen 2 years ago. No home health. No skilled facility. No home oxygen. Daughter helps her with bathing. Appetite is "so-so." States she has fallen getting out of the shower about 2 months ago. States she has a walker, but doesn't use it or know where it's located. She and her daughter are in the process of moving in with grandson.  Daughter will transport. Will need physical therapy evaluation. Gwenette GreetBrenda S Holland RN MSN Care Management [phone]

## 2015-03-11 NOTE — Plan of Care (Signed)
Problem: Discharge Progression Outcomes Goal: Discharge plan in place and appropriate Individualization Outcome: Progressing Pt accompanied by daughter, Darla.    Has a hx of COPD, DM, HTN, CHF.  Continues to smoke at home. Does not use O2 at home. Stress incontinence. Goal: Other Discharge Outcomes/Goals Outcome: Progressing Patient continues to complain of pain in the upper abdomen - radiating towards the back, relieved with Norco. C/o cough - cough suppressant given with relief. Patient remains on 4L-O2. Dyspnea on exertion continues. [facility] one person assist.

## 2015-03-11 NOTE — Progress Notes (Signed)
     Inpatient Diabetes Program Recommendations  AACE/ADA: New Consensus Statement on Inpatient Glycemic Control (2013)  Target Ranges:  Prepandial:   less than 140 mg/dL      Peak postprandial:   less than 180 mg/dL (1-2 hours)      Critically ill patients:  140 - 180 mg/dL   Results for Dominique Burgess, Dominique Burgess (MRN [id number]) as of 03/11/2015 08:18  Ref. Range 03/10/2015 08:30 03/10/2015 12:08 03/10/2015 15:54 03/10/2015 22:06 03/11/2015 07:02  Glucose-Capillary Latest Ref Range: 65-99 mg/dL 409271 (H) 811213 (H) 914181 (H) 209 (H) 221 (H)      Reason for assessment: elevated CBG  Diabetes history: Type 2 Outpatient Diabetes medications: Glucophage 1000mg  bid Current orders for Inpatient glycemic control: Novolog correction moderate correction 0-15 units tid with meals and 0-5 units qhs, Metformin 1000mg  bid  Please consider increasing Novolog correction to resistant scale 0-20 units tid with meal (steroids)- current post prandial blood sugars are elevated despite current correction orders.   Consider adding Lantus 10 units (0.1units/kg) q day as fasting CBG was 221mg /dl today and 271mg /dl yesterday.   Susette RacerJulie Lashaunta Sicard, RN, BA, MHA, CDE Diabetes Coordinator Inpatient Diabetes Program  [phone] (Team Pager) [phone] [facility] Office) 03/11/2015 8:18 AM

## 2015-03-12 LAB — GLUCOSE, CAPILLARY
Glucose-Capillary: 233 mg/dL — ABNORMAL HIGH (ref 65–99)
Glucose-Capillary: 166 mg/dL — ABNORMAL HIGH (ref 65–99)
Glucose-Capillary: 178 mg/dL — ABNORMAL HIGH (ref 65–99)
Glucose-Capillary: 173 mg/dL — ABNORMAL HIGH (ref 65–99)

## 2015-03-12 LAB — CBC
HCT: 34.5 % — ABNORMAL LOW (ref 35.0–47.0)
Hemoglobin: 10.9 g/dL — ABNORMAL LOW (ref 12.0–16.0)
MCH: 26.2 pg (ref 26.0–34.0)
MCHC: 31.6 g/dL — ABNORMAL LOW (ref 32.0–36.0)
MCV: 83.1 fL (ref 80.0–100.0)
Platelets: 267 10*3/uL (ref 150–440)
RBC: 4.14 MIL/uL (ref 3.80–5.20)
RDW: 17 % — ABNORMAL HIGH (ref 11.5–14.5)
WBC: 8.2 10*3/uL (ref 3.6–11.0)

## 2015-03-12 LAB — BASIC METABOLIC PANEL WITH GFR
Anion gap: 10 (ref 5–15)
BUN: 35 mg/dL — ABNORMAL HIGH (ref 6–20)
CO2: 29 mmol/L (ref 22–32)
Calcium: 7.4 mg/dL — ABNORMAL LOW (ref 8.9–10.3)
Chloride: 94 mmol/L — ABNORMAL LOW (ref 101–111)
Creatinine, Ser: 1.08 mg/dL — ABNORMAL HIGH (ref 0.44–1.00)
GFR calc Af Amer: >60 mL/min
GFR calc non Af Amer: 52 mL/min — ABNORMAL LOW
Glucose, Bld: 245 mg/dL — ABNORMAL HIGH (ref 65–99)
Potassium: 5.1 mmol/L (ref 3.5–5.1)
Sodium: 133 mmol/L — ABNORMAL LOW (ref 135–145)

## 2015-03-12 MED ORDER — IPRATROPIUM-ALBUTEROL 0.5-2.5 (3) MG/3ML IN SOLN
3.0000 mL | Freq: Four times a day (QID) | RESPIRATORY_TRACT | Status: DC
  Administered 2015-03-13 (×4): 3 mL via RESPIRATORY_TRACT
  Filled 2015-03-12 (×5): qty 3

## 2015-03-12 MED ORDER — ALPRAZOLAM 0.5 MG PO TABS
0.5000 mg | ORAL_TABLET | Freq: Once | ORAL | Status: AC
  Administered 2015-03-12: 0.5 mg via ORAL
  Filled 2015-03-12: qty 1

## 2015-03-12 MED ORDER — INSULIN ASPART 100 UNIT/ML ~~LOC~~ SOLN
3.0000 [IU] | Freq: Three times a day (TID) | SUBCUTANEOUS | Status: DC
  Administered 2015-03-12 – 2015-03-14 (×5): 3 [IU] via SUBCUTANEOUS
  Filled 2015-03-12 (×7): qty 3

## 2015-03-12 MED ORDER — METHYLPREDNISOLONE SODIUM SUCC 40 MG IJ SOLR
40.0000 mg | Freq: Every day | INTRAMUSCULAR | Status: DC
  Administered 2015-03-13 – 2015-03-14 (×2): 40 mg via INTRAVENOUS
  Filled 2015-03-12 (×2): qty 1

## 2015-03-12 NOTE — Progress Notes (Signed)
Patient's family refuses bed alarm

## 2015-03-12 NOTE — Plan of Care (Signed)
Problem: Discharge Progression Outcomes Goal: Other Discharge Outcomes/Goals Outcome: Progressing Progress toward goals: O2 sats: pt oxygen saturation decreases to the 80's while sleeping.  Spoke to Dr Cherlynn KaiserSainani. CPAP order written for PRN use. Explained/educated pt on CPAP and why needed. Home O2: pt currently does not have home oxygen ordered. Will check to see if she will qualify within 24 hours of discharge Independent in ADLs/activity:  Pt can move self in bed independently. Stand by assist to ambulate. Pain control: Pt stated she has rib pain with coughing. Instructed to splint side when coughing. No other pain issues per pt. Diet: pt tolerating diet with no nnausea or vomiting. Hemodynamically: Pt is stable hemodynamically.

## 2015-03-12 NOTE — Progress Notes (Signed)
[facility] [place] [facility]   PATIENT NAME: Dominique DroneBrenda Burgess    MR#:  [id number]  DATE OF BIRTH:  03-Jan-1946  SUBJECTIVE:  CHIEF COMPLAINT:   Chief Complaint  Patient presents with  . Shortness of Breath   Feels much better since admission.  Shortness of breat improved. O2 sats still drop into 80's when sleeping or taking naps.    Review of Systems  Constitutional: Negative for fever, chills and weight loss.  HENT: Negative for congestion.   Eyes: Negative for blurred vision and double vision.  Respiratory: Positive for shortness of breath (improved) and wheezing. Negative for cough and sputum production.   Cardiovascular: Negative for chest pain, palpitations, orthopnea, leg swelling and PND.  Gastrointestinal: Negative for nausea, vomiting, abdominal pain, diarrhea, constipation and blood in stool.  Genitourinary: Negative for dysuria, urgency, frequency and hematuria.  Musculoskeletal: Negative for falls.  Neurological: Negative for dizziness, tremors, focal weakness and headaches.  Endo/Heme/Allergies: Does not bruise/bleed easily.  Psychiatric/Behavioral: Negative for depression. The patient does not have insomnia.     VITAL SIGNS: Blood pressure 129/47, pulse 94, temperature 97.8 F (36.6 C), temperature source Oral, resp. rate 18, height 5\' 2"  (1.575 m), weight 96.248 kg (212 lb 3 oz), SpO2 98 %.  PHYSICAL EXAMINATION:   GENERAL:  69 y.o.-year-old obese patient sitting up in bed in no acute distress. Marland Kitchen.   EYES: Pupils equal, round, reactive to light and accommodation. No scleral icterus. Extraocular muscles intact.  HEENT: Head atraumatic, normocephalic. Oropharynx and nasopharynx clear.  NECK:  Supple, no jugular venous distention. No thyroid enlargement, no tenderness.  LUNGS: Minimal end-exp. Wheezing.  No use of accessory muscles. No dullness to percussion. No rales or rhonchi.  CARDIOVASCULAR: S1, S2 normal. No murmurs, rubs, or gallops.   ABDOMEN: Soft, nontender, nondistended. Bowel sounds present. No organomegaly or mass.  EXTREMITIES: No cyanosis + clubbing. 1+ lower extremity edema NEUROLOGIC: Cranial nerves II through XII are intact. No focal motor or sensory deficits appreciated bilaterally. Globally weak. PSYCHIATRIC: The patient is alert and oriented x 3. Good affect.  SKIN: No obvious rash, lesion, or ulcer.   ORDERS/RESULTS REVIEWED:   CBC  Recent Labs Lab 03/09/15 1309 03/10/15 0433 03/12/15 0413  WBC 9.6 7.9 8.2  HGB 11.9* 11.5* 10.9*  HCT 37.1 35.2 34.5*  PLT 278 251 267  MCV 81.8 82.2 83.1  MCH 26.3 26.7 26.2  MCHC 32.2 32.6 31.6*  RDW 17.3* 17.4* 17.0*  LYMPHSABS 1.4  --   --   MONOABS 0.4  --   --   EOSABS 0.1  --   --   BASOSABS 0.0  --   --    ------------------------------------------------------------------------------------------------------------------  Chemistries   Recent Labs Lab 03/09/15 1309 03/10/15 0433 03/12/15 0413  NA 135 132* 133*  K 3.4* 4.0 5.1  CL 93* 91* 94*  CO2 28 28 29   GLUCOSE 152* 197* 245*  BUN 6 13 35*  CREATININE 0.82 0.95 1.08*  CALCIUM 7.7* 7.3* 7.4*   ------------------------------------------------------------------------------------------------------------------ estimated creatinine clearance is 54.7 mL/min (by C-G formula based on Cr of 1.08). ------------------------------------------------------------------------------------------------------------------ No results for input(s): TSH, T4TOTAL, T3FREE, THYROIDAB in the last 72 hours.  Invalid input(s): FREET3  Cardiac Enzymes  Recent Labs Lab 03/09/15 1309  TROPONINI <0.03   ------------------------------------------------------------------------------------------------------------------ Invalid input(s): POCBNP ---------------------------------------------------------------------------------------------------------------  RADIOLOGY: No results found.  ASSESSMENT AND  PLAN:  69 year old female with past medical history of diabetes, hypertension, dementia, COPD with ongoing tobacco abuse who presented to the  hospital due to shortness of breath and wheezing and noted to be in COPD exacerbation.  1. Acute respiratory failure - likely multifactorial. Combination of COPD exacerbation/CHF/obstructive sleep apnea. -much improved since admission. Continue IV steroids but will taper, cont. Lasix - cont. CPAP w/ naps and bedtime.  Needs outpatient sleep study.   - assess for Home O2 prior to discharge.   2. Acute bronchitis - improving-await sputum cultures. Continue Levaquin.  3. Acute diastolic congestive heart failure-improved with diuresis. -Continue Lasix, follow I's and O's and daily weights. -Echocardiogram done showing normal ejection fraction of 60%.  4. COPD exacerbation-continue IV steroids but will taper, cont. around-the-clock nebs, Spiriva, Symbicort. -Assess for home oxygen prior to discharge.  5. Suspected obstructive sleep apnea. Patient needs sleep study as outpatient.   6. Obesity, patient's hemoglobin A1c was found to be 7.1, thus she has diabetes mellitus.  -Continue sliding scale insulin, appreciate diabetic education and will start low-dose Lantus.  Seen by PT and they recommend Home health services.  Will likely d/c home tomorrow w/ Home health PT/RN.     DRUG ALLERGIES:  Allergies  Allergen Reactions  . Penicillins Hives and Shortness Of Breath    CODE STATUS:     Code Status Orders        Start     Ordered   03/09/15 1747  Full code   Continuous     03/09/15 1746      TOTAL TIME TAKING CARE OF THIS PATIENT: 30 minutes.     Houston Siren M.D on 03/12/2015 at 2:08 PM  Between 7am to 6pm - Pager - [phone]  After 6pm go to [email or web address] - password [email or web address] [facility]  [place] [facility]  Office  [phone]  CC: Primary care physician; Hyman Hopes, MD

## 2015-03-12 NOTE — Plan of Care (Signed)
Problem: Discharge Progression Outcomes Goal: Other Discharge Outcomes/Goals Plan of care progress to goal: Dyspnea: continues to SOB on exertion O2 at 4L Activity: one person stand by assistance Diet: zofran given x1 for nausea with improvement

## 2015-03-13 LAB — GLUCOSE, CAPILLARY
Glucose-Capillary: 171 mg/dL — ABNORMAL HIGH (ref 65–99)
Glucose-Capillary: 179 mg/dL — ABNORMAL HIGH (ref 65–99)
Glucose-Capillary: 289 mg/dL — ABNORMAL HIGH (ref 65–99)
Glucose-Capillary: 163 mg/dL — ABNORMAL HIGH (ref 65–99)

## 2015-03-13 MED ORDER — ALPRAZOLAM 0.25 MG PO TABS
0.2500 mg | ORAL_TABLET | Freq: Three times a day (TID) | ORAL | Status: DC | PRN
  Administered 2015-03-13: 0.25 mg via ORAL
  Filled 2015-03-13: qty 1

## 2015-03-13 MED ORDER — GLYCERIN (LAXATIVE) 2.1 G RE SUPP
1.0000 | Freq: Every day | RECTAL | Status: DC | PRN
  Administered 2015-03-13: 06:00:00 1 via RECTAL
  Filled 2015-03-13: qty 1

## 2015-03-13 MED ORDER — SENNA 8.6 MG PO TABS
1.0000 | ORAL_TABLET | Freq: Every day | ORAL | Status: DC
  Administered 2015-03-13: 06:00:00 8.6 mg via ORAL
  Filled 2015-03-13: qty 1

## 2015-03-13 NOTE — Progress Notes (Signed)
CCU called patient o2 sats in the 50's  Entered room patient sleeping she woke up but falls asleep quickly sats went back into the low 90's let respiratory know and the covering RN  Garry HeaterMary W.   Sat patent up  and had her cough and deep breath

## 2015-03-13 NOTE — Plan of Care (Signed)
Problem: Discharge Progression Outcomes Goal: Other Discharge Outcomes/Goals Outcome: Progressing  Progress toward goals: O2 sats: attempted to place patient on CPAP, unable to tolerate 02 sats at times in 60's on continous pulse ox, pt now on 6 L while sleeping 89-92 pt has been encouraged to use cpap, refusing   Home O2: pt currently does not have home oxygen ordered.  Independent in ADLs/activity:  Pt can move self in bed independently. Stand by assist to ambulate. Pain control: Pt stated she has rib pain with coughing. Instructed to splint side when coughing. No other pain issues per pt. Diet: pt tolerating diet with no nnausea or vomiting. Hemodynamically: Pt is stable hemodynamically.

## 2015-03-13 NOTE — Progress Notes (Signed)
[facility] [place] [facility]   PATIENT NAME: Dominique Burgess    MR#:  [id number]  DATE OF BIRTH:  [birth year]  SUBJECTIVE:  CHIEF COMPLAINT:   Chief Complaint  Patient presents with  . Shortness of Breath   Source of breath has improved overall. Patient although could not tolerate the CPAP last night as she was very claustrophobic.   Review of Systems  Constitutional: Negative for fever, chills and weight loss.  HENT: Negative for congestion.   Eyes: Negative for blurred vision and double vision.  Respiratory: Positive for shortness of breath (improved) and wheezing (minimal). Negative for cough and sputum production.   Cardiovascular: Negative for chest pain, palpitations, orthopnea, leg swelling and PND.  Gastrointestinal: Negative for nausea, vomiting, abdominal pain, diarrhea, constipation and blood in stool.  Genitourinary: Negative for dysuria, urgency, frequency and hematuria.  Musculoskeletal: Negative for falls.  Neurological: Negative for dizziness, tremors, focal weakness and headaches.  Endo/Heme/Allergies: Does not bruise/bleed easily.  Psychiatric/Behavioral: Negative for depression. The patient does not have insomnia.     VITAL SIGNS: Blood pressure 147/65, pulse 108, temperature 98.2 F (36.8 C), temperature source Oral, resp. rate 18, height 5\' 2"  (1.575 m), weight 96.248 kg (212 lb 3 oz), SpO2 94 %.  PHYSICAL EXAMINATION:   GENERAL:  [age] y.o.-year-old obese patient sitting up in bed in no acute distress. Marland Kitchen.   EYES: Pupils equal, round, reactive to light and accommodation. No scleral icterus. Extraocular muscles intact.  HEENT: Head atraumatic, normocephalic. Oropharynx and nasopharynx clear.  NECK:  Supple, no jugular venous distention. No thyroid enlargement, no tenderness.  LUNGS: Minimal end-exp. Wheezing.  No use of accessory muscles. No dullness to percussion. No rales or rhonchi.  CARDIOVASCULAR: S1, S2 normal. No murmurs, rubs,  or gallops.  ABDOMEN: Soft, nontender, nondistended. Bowel sounds present. No organomegaly or mass.  EXTREMITIES: No cyanosis + clubbing. 1+ lower extremity edema NEUROLOGIC: Cranial nerves II through XII are intact. No focal motor or sensory deficits appreciated bilaterally. Globally weak. PSYCHIATRIC: The patient is alert and oriented x 3. Good affect.  SKIN: No obvious rash, lesion, or ulcer.   ORDERS/RESULTS REVIEWED:   CBC  Recent Labs Lab 03/09/15 1309 03/10/15 0433 03/12/15 0413  WBC 9.6 7.9 8.2  HGB 11.9* 11.5* 10.9*  HCT 37.1 35.2 34.5*  PLT 278 251 267  MCV 81.8 82.2 83.1  MCH 26.3 26.7 26.2  MCHC 32.2 32.6 31.6*  RDW 17.3* 17.4* 17.0*  LYMPHSABS 1.4  --   --   MONOABS 0.4  --   --   EOSABS 0.1  --   --   BASOSABS 0.0  --   --    ------------------------------------------------------------------------------------------------------------------  Chemistries   Recent Labs Lab 03/09/15 1309 03/10/15 0433 03/12/15 0413  NA 135 132* 133*  K 3.4* 4.0 5.1  CL 93* 91* 94*  CO2 28 28 29   GLUCOSE 152* 197* 245*  BUN 6 13 35*  CREATININE 0.82 0.95 1.08*  CALCIUM 7.7* 7.3* 7.4*   ------------------------------------------------------------------------------------------------------------------ estimated creatinine clearance is 54.7 mL/min (by C-G formula based on Cr of 1.08). ------------------------------------------------------------------------------------------------------------------ No results for input(s): TSH, T4TOTAL, T3FREE, THYROIDAB in the last 72 hours.  Invalid input(s): FREET3  Cardiac Enzymes  Recent Labs Lab 03/09/15 1309  TROPONINI <0.03   ------------------------------------------------------------------------------------------------------------------ Invalid input(s): POCBNP ---------------------------------------------------------------------------------------------------------------  RADIOLOGY: No results found.  ASSESSMENT AND  PLAN:  69 year old female with past medical history of diabetes, hypertension, dementia, COPD with ongoing tobacco abuse who presented to the hospital  due to shortness of breath and wheezing and noted to be in COPD exacerbation.  1. Acute respiratory failure - likely multifactorial. Combination of COPD exacerbation/CHF/obstructive sleep apnea. -much improved since admission. On IV Steriods and will taper to Oral Prednisone tomorrow, cont. Lasix - could not tolerate CPAP as pt. Was claustrophobic and will attempt a nasal mask today.  Will try some Xanax for anxiety prior to placing CPAP.   2. Acute bronchitis - much improved.  - Continue Levaquin.  3. Acute diastolic congestive heart failure-improved with diuresis. -Continue Lasix, follow I's and O's and daily weights. -Echocardiogram done showing normal ejection fraction of 60%.  4. COPD exacerbation-continue IV steroids but will taper to oral prednisone in a.m.  -cont. around-the-clock nebs, Spiriva, Symbicort. -Likely will need home O2 prior to discharge  5. Suspected obstructive sleep apnea. Patient needs sleep study as outpatient.  -Could not tolerate the facial CPAP mask and we'll attempt to nasal mask today  6. Obesity, patient's hemoglobin A1c was found to be 7.1, thus she has diabetes mellitus.  - cont. Lantus, Novolog with meals.   Seen by PT and they recommend Home health services.  Will likely d/c home tomorrow w/ Home health PT/RN.     DRUG ALLERGIES:  Allergies  Allergen Reactions  . Penicillins Hives and Shortness Of Breath    CODE STATUS:     Code Status Orders        Start     Ordered   03/09/15 1747  Full code   Continuous     03/09/15 1746      TOTAL TIME TAKING CARE OF THIS PATIENT: 30 minutes.     Houston Siren M.D on 03/13/2015 at 10:47 AM  Between 7am to 6pm - Pager - [phone]  After 6pm go to [email or web address] - password [email or web address] [facility]  [place] [facility]  Office   [phone]  CC: Primary care physician; Hyman Hopes, MD

## 2015-03-13 NOTE — Progress Notes (Signed)
Pt was taken off cpap at her insistence for inability to tolerate.  During sleep, she exhibits significant snoring and O2 [place] had to be increased to 6 lpm for 92 sat.

## 2015-03-13 NOTE — Plan of Care (Signed)
Problem: Discharge Progression Outcomes Goal: Other Discharge Outcomes/Goals Outcome: Progressing Progress toward goals: Dyspnea: Pt continues to get dyspneic with exertion. Pt is taking it slow when doing physical activities that increase her dyspnea. O2 sats: O2 sats fluctuated   This am buit have decreased throughout the day. Home O2: pt will need home oxygen, ADLs: pt is independnent in ADLS Discharge plan: Discharge is planned for tomorrow.  Pain: Pt has denied pain. Hemodynamically stable today.

## 2015-03-13 NOTE — Progress Notes (Signed)
Pt unable to tolerate cpap.

## 2015-03-13 NOTE — Progress Notes (Signed)
Spoke to dr hower, patient states that she is unable to wear cpap, she feels like she cannot breathe and that her heart is beating out of her chest, patient has been upper 90's on continuous pulse ox and SR on telemetry. Dr Clint GuyHower ordered 0.5 xanax to see if patient could tolerate , she refused to put cpap back on.

## 2015-03-14 LAB — GLUCOSE, CAPILLARY: Glucose-Capillary: 229 mg/dL — ABNORMAL HIGH (ref 65–99)

## 2015-03-14 MED ORDER — TIOTROPIUM BROMIDE MONOHYDRATE 18 MCG IN CAPS: 18.0000 ug | ORAL_CAPSULE | Freq: Every day | RESPIRATORY_TRACT | Status: AC

## 2015-03-14 MED ORDER — METFORMIN HCL 500 MG PO TABS: 1000.0000 mg | ORAL_TABLET | Freq: Two times a day (BID) | ORAL | Status: AC

## 2015-03-14 MED ORDER — LIVING WELL WITH DIABETES BOOK
Freq: Once | Status: DC
  Filled 2015-03-14: qty 1

## 2015-03-14 MED ORDER — LEVOFLOXACIN 750 MG PO TABS: 750.0000 mg | ORAL_TABLET | Freq: Every day | ORAL | Status: AC

## 2015-03-14 MED ORDER — PREDNISONE 10 MG PO TABS: 10.0000 mg | ORAL_TABLET | Freq: Every day | ORAL | Status: AC

## 2015-03-14 MED ORDER — INSULIN ASPART 100 UNIT/ML ~~LOC~~ SOLN: 3.0000 [IU] | Freq: Three times a day (TID) | SUBCUTANEOUS | Status: AC

## 2015-03-14 MED ORDER — FLEET ENEMA 7-19 GM/118ML RE ENEM: 1.0000 | ENEMA | Freq: Once | RECTAL | Status: DC

## 2015-03-14 MED ORDER — INSULIN GLARGINE 100 UNIT/ML ~~LOC~~ SOLN: 10.0000 [IU] | Freq: Every day | SUBCUTANEOUS | Status: AC

## 2015-03-14 NOTE — Care Management (Signed)
IM given. Will be discharge to home today per Dr. Nonie HoyerSaniani.  Home oxygen, home health and physical therapy will be provided by [facility]. Will Anderson, durable medical equipment representative updated. Family will transport, Gwenette GreetBrenda S Holland RN MSN Care Management [phone]

## 2015-03-14 NOTE — Discharge Instructions (Signed)
DIET:  Cardiac diet and Diabetic diet  DISCHARGE CONDITION:  Stable  ACTIVITY:  Activity as tolerated  OXYGEN:  Home Oxygen: Yes.     Oxygen Delivery: 2 liters/min via Patient connected to nasal cannula oxygen  DISCHARGE LOCATION:  Home with home health nursing/PT.     If you experience worsening of your admission symptoms, develop shortness of breath, life threatening emergency, suicidal or homicidal thoughts you must seek medical attention immediately by calling 911 or calling your MD immediately  if symptoms less severe.  You Must read complete instructions/literature along with all the possible adverse reactions/side effects for all the Medicines you take and that have been prescribed to you. Take any new Medicines after you have completely understood and accpet all the possible adverse reactions/side effects.   Please note  You were cared for by a hospitalist during your hospital stay. If you have any questions about your discharge medications or the care you received while you were in the hospital after you are discharged, you can call the unit and asked to speak with the hospitalist on call if the hospitalist that took care of you is not available. Once you are discharged, your primary care physician will handle any further medical issues. Please note that NO REFILLS for any discharge medications will be authorized once you are discharged, as it is imperative that you return to your primary care physician (or establish a relationship with a primary care physician if you do not have one) for your aftercare needs so that they can reassess your need for medications and monitor your lab values.    Acute Respiratory Failure Respiratory failure is when your lungs are not working well and your breathing (respiratory) system fails. When respiratory failure occurs, it is difficult for your lungs to get enough oxygen, get rid of carbon dioxide, or both. Respiratory failure can be life  threatening.  Respiratory failure can be acute or chronic. Acute respiratory failure is sudden, severe, and requires emergency medical treatment. Chronic respiratory failure is less severe, happens over time, and requires ongoing treatment.  WHAT ARE THE CAUSES OF ACUTE RESPIRATORY FAILURE?  Any problem affecting the heart or lungs can cause acute respiratory failure. Some of these causes include the following:  Chronic bronchitis and emphysema (COPD).   Blood clot going to a lung (pulmonary embolism).   Having water in the lungs caused by heart failure, lung injury, or infection (pulmonary edema).   Collapsed lung (pneumothorax).   Pneumonia.   Pulmonary fibrosis.   Obesity.   Asthma.   Heart failure.   Any type of trauma to the chest that can make breathing difficult.   Nerve or muscle diseases making chest movements difficult. WHAT SYMPTOMS SHOULD YOU WATCH FOR?  If you have any of these signs or symptoms, you should seek immediate medical care:   You have shortness of breath (dyspnea) with or without activity.   You have rapid, fast breathing (tachypnea).   You are wheezing.  You are unable to say more than a few words without having to catch your breath.  You find it very difficult to function normally.  You have a fast heart rate.   You have a bluish color to your finger or toe nail beds.   You have confusion or drowsiness or both.  HOW WILL MY ACUTE RESPIRATORY FAILURE BE TREATED?  Treatment of acute respiratory failure depends on the cause of the respiratory failure. Usually, you will stay in the intensive care unit so  your breathing can be watched closely. Treatment can include the following:  Oxygen. Oxygen can be delivered through the following:  Nasal cannula. This is small tubing that goes in your nose to give you oxygen.  Face mask. A face mask covers your nose and mouth to give you oxygen.  Medicine. Different medicines can be given  to help with breathing. These can include:  Nebulizers. Nebulizers deliver medicines to open the air passages (bronchodilators). These medicines help to open or relax the airways in the lungs so you can breathe better. They can also help loosen mucus from your lungs.  Diuretics. Diuretic medicines can help you breathe better by getting rid of extra water in your body.  Steroids. Steroid medicines can help decrease swelling (inflammation) in your lungs.  Antibiotics.  Chest tube. If you have a collapsed lung (pneumothorax), a chest tube is placed to help reinflate the lung.  Non-invasive positive pressure ventilation (NPPV). This is a tight-fitting mask that goes over your nose and mouth. The mask has tubing that is attached to a machine. The machine blows air into the tubing, which helps to keep the tiny air sacs (alveoli) in your lungs open. This machine allows you to breathe on your own.  Ventilator. A ventilator is a breathing machine. When on a ventilator, a breathing tube is put into the lungs. A ventilator is used when you can no longer breathe well enough on your own. You Dreisbach have low oxygen levels or high carbon dioxide (CO2) levels in your blood. When you are on a ventilator, sedation and pain medicines are given to make you sleep so your lungs can heal. Document Released: 09/01/2013 Document Revised: 01/11/2014 Document Reviewed: 09/01/2013 [facility] Patient Information 2015 [place], [place]. This information is not intended to replace advice given to you by your health care provider. Make sure you discuss any questions you have with your health care provider.  Asthma Attack Prevention Although there is no way to prevent asthma from starting, you can take steps to control the disease and reduce its symptoms. Learn about your asthma and how to control it. Take an active role to control your asthma by working with your health care provider to create and follow an asthma action plan. An  asthma action plan guides you in:  Taking your medicines properly.  Avoiding things that set off your asthma or make your asthma worse (asthma triggers).  Tracking your level of asthma control.  Responding to worsening asthma.  Seeking emergency care when needed. To track your asthma, keep records of your symptoms, check your peak flow number using a handheld device that shows how well air moves out of your lungs (peak flow meter), and get regular asthma checkups.  WHAT ARE SOME WAYS TO PREVENT AN ASTHMA ATTACK?  Take medicines as directed by your health care provider.  Keep track of your asthma symptoms and level of control.  With your health care provider, write a detailed plan for taking medicines and managing an asthma attack. Then be sure to follow your action plan. Asthma is an ongoing condition that needs regular monitoring and treatment.  Identify and avoid asthma triggers. Many outdoor allergens and irritants (such as pollen, mold, cold air, and air pollution) can trigger asthma attacks. Find out what your asthma triggers are and take steps to avoid them.  Monitor your breathing. Learn to recognize warning signs of an attack, such as coughing, wheezing, or shortness of breath. Your lung function Desmarais decrease before you notice  any signs or symptoms, so regularly measure and record your peak airflow with a home peak flow meter.  Identify and treat attacks early. If you act quickly, you are less likely to have a severe attack. You will also need less medicine to control your symptoms. When your peak flow measurements decrease and alert you to an upcoming attack, take your medicine as instructed and immediately stop any activity that Hussein have triggered the attack. If your symptoms do not improve, get medical help.  Pay attention to increasing quick-relief inhaler use. If you find yourself relying on your quick-relief inhaler, your asthma is not under control. See your health care  provider about adjusting your treatment. WHAT CAN MAKE MY SYMPTOMS WORSE? A number of common things can set off or make your asthma symptoms worse and cause temporary increased inflammation of your airways. Keep track of your asthma symptoms for several weeks, detailing all the environmental and emotional factors that are linked with your asthma. When you have an asthma attack, go back to your asthma diary to see which factor, or combination of factors, might have contributed to it. Once you know what these factors are, you can take steps to control many of them. If you have allergies and asthma, it is important to take asthma prevention steps at home. Minimizing contact with the substance to which you are allergic will help prevent an asthma attack. Some triggers and ways to avoid these triggers are: Animal Dander:  Some people are allergic to the flakes of skin or dried saliva from animals with fur or feathers.   There is no such thing as a hypoallergenic dog or cat breed. All dogs or cats can cause allergies, even if they don't shed.  Keep these pets out of your home.  If you are not able to keep a pet outdoors, keep the pet out of your bedroom and other sleeping areas at all times, and keep the door closed.  Remove carpets and furniture covered with cloth from your home. If that is not possible, keep the pet away from fabric-covered furniture and carpets. Dust Mites: Many people with asthma are allergic to dust mites. Dust mites are tiny bugs that are found in every home in mattresses, pillows, carpets, fabric-covered furniture, bedcovers, clothes, stuffed toys, and other fabric-covered items.   Cover your mattress in a special dust-proof cover.  Cover your pillow in a special dust-proof cover, or wash the pillow each week in hot water. Water must be hotter than 130 F (54.4 C) to kill dust mites. Cold or warm water used with detergent and bleach can also be effective.  Wash the sheets and  blankets on your bed each week in hot water.  Try not to sleep or lie on cloth-covered cushions.  Call ahead when traveling and ask for a smoke-free hotel room. Bring your own bedding and pillows in case the hotel only supplies feather pillows and down comforters, which Pietila contain dust mites and cause asthma symptoms.  Remove carpets from your bedroom and those laid on concrete, if you can.  Keep stuffed toys out of the bed, or wash the toys weekly in hot water or cooler water with detergent and bleach. Cockroaches: Many people with asthma are allergic to the droppings and remains of cockroaches.   Keep food and garbage in closed containers. Never leave food out.  Use poison baits, traps, powders, gels, or paste (for example, boric acid).  If a spray is used to kill cockroaches, stay  out of the room until the odor goes away. Indoor Mold:  Fix leaky faucets, pipes, or other sources of water that have mold around them.  Clean floors and moldy surfaces with a fungicide or diluted bleach.  Avoid using humidifiers, vaporizers, or swamp coolers. These can spread molds through the air. Pollen and Outdoor Mold:  When pollen or mold spore counts are high, try to keep your windows closed.  Stay indoors with windows closed from late morning to afternoon. Pollen and some mold spore counts are highest at that time.  Ask your health care provider whether you need to take anti-inflammatory medicine or increase your dose of the medicine before your allergy season starts. Other Irritants to Avoid:  Tobacco smoke is an irritant. If you smoke, ask your health care provider how you can quit. Ask family members to quit smoking, too. Do not allow smoking in your home or car.  If possible, do not use a wood-burning stove, kerosene heater, or fireplace. Minimize exposure to all sources of smoke, including incense, candles, fires, and fireworks.  Try to stay away from strong odors and sprays, such as  perfume, talcum powder, hair spray, and paints.  Decrease humidity in your home and use an indoor air cleaning device. Reduce indoor humidity to below 60%. Dehumidifiers or central air conditioners can do this.  Decrease house dust exposure by changing furnace and air cooler filters frequently.  Try to have someone else vacuum for you once or twice a week. Stay out of rooms while they are being vacuumed and for a short while afterward.  If you vacuum, use a dust mask from a hardware store, a double-layered or microfilter vacuum cleaner bag, or a vacuum cleaner with a HEPA filter.  Sulfites in foods and beverages can be irritants. Do not drink beer or wine or eat dried fruit, processed potatoes, or shrimp if they cause asthma symptoms.  Cold air can trigger an asthma attack. Cover your nose and mouth with a scarf on cold or windy days.  Several health conditions can make asthma more difficult to manage, including a runny nose, sinus infections, reflux disease, psychological stress, and sleep apnea. Work with your health care provider to manage these conditions.  Avoid close contact with people who have a respiratory infection such as a cold or the flu, since your asthma symptoms Salceda get worse if you catch the infection. Wash your hands thoroughly after touching items that Erskin have been handled by people with a respiratory infection.  Get a flu shot every year to protect against the flu virus, which often makes asthma worse for days or weeks. Also get a pneumonia shot if you have not previously had one. Unlike the flu shot, the pneumonia shot does not need to be given yearly. Medicines:  Talk to your health care provider about whether it is safe for you to take aspirin or non-steroidal anti-inflammatory medicines (NSAIDs). In a small number of people with asthma, aspirin and NSAIDs can cause asthma attacks. These medicines must be avoided by people who have known aspirin-sensitive asthma. It is  important that people with aspirin-sensitive asthma read labels of all over-the-counter medicines used to treat pain, colds, coughs, and fever.  Beta-blockers and ACE inhibitors are other medicines you should discuss with your health care provider. HOW CAN I FIND OUT WHAT I AM ALLERGIC TO? Ask your asthma health care provider about allergy skin testing or blood testing (the RAST test) to identify the allergens to which you  are sensitive. If you are found to have allergies, the most important thing to do is to try to avoid exposure to any allergens that you are sensitive to as much as possible. Other treatments for allergies, such as medicines and allergy shots (immunotherapy) are available.  CAN I EXERCISE? Follow your health care provider's advice regarding asthma treatment before exercising. It is important to maintain a regular exercise program, but vigorous exercise or exercise in cold, humid, or dry environments can cause asthma attacks, especially for those people who have exercise-induced asthma. Document Released: 08/15/2009 Document Revised: 09/01/2013 Document Reviewed: 03/04/2013 [facility] Patient Information 2015 [place], [place]. This information is not intended to replace advice given to you by your health care provider. Make sure you discuss any questions you have with your health care provider.  Asthma Asthma is a recurring condition in which the airways tighten and narrow. Asthma can make it difficult to breathe. It can cause coughing, wheezing, and shortness of breath. Asthma episodes, also called asthma attacks, range from minor to life-threatening. Asthma cannot be cured, but medicines and lifestyle changes can help control it. CAUSES Asthma is believed to be caused by inherited (genetic) and environmental factors, but its exact cause is unknown. Asthma Sliney be triggered by allergens, lung infections, or irritants in the air. Asthma triggers are different for each person. Common  triggers include:   Animal dander.  Dust mites.  Cockroaches.  Pollen from trees or grass.  Mold.  Smoke.  Air pollutants such as dust, household cleaners, hair sprays, aerosol sprays, paint fumes, strong chemicals, or strong odors.  Cold air, weather changes, and winds (which increase molds and pollens in the air).  Strong emotional expressions such as crying or laughing hard.  Stress.  Certain medicines (such as aspirin) or types of drugs (such as beta-blockers).  Sulfites in foods and drinks. Foods and drinks that Trembley contain sulfites include dried fruit, potato chips, and sparkling grape juice.  Infections or inflammatory conditions such as the flu, a cold, or an inflammation of the nasal membranes (rhinitis).  Gastroesophageal reflux disease (GERD).  Exercise or strenuous activity. SYMPTOMS Symptoms Billiot occur immediately after asthma is triggered or many hours later. Symptoms include:  Wheezing.  Excessive nighttime or early morning coughing.  Frequent or severe coughing with a common cold.  Chest tightness.  Shortness of breath. DIAGNOSIS  The diagnosis of asthma is made by a review of your medical history and a physical exam. Tests Swartzentruber also be performed. These Macrae include:  Lung function studies. These tests show how much air you breathe in and out.  Allergy tests.  Imaging tests such as X-rays. TREATMENT  Asthma cannot be cured, but it can usually be controlled. Treatment involves identifying and avoiding your asthma triggers. It also involves medicines. There are 2 classes of medicine used for asthma treatment:   Controller medicines. These prevent asthma symptoms from occurring. They are usually taken every day.  Reliever or rescue medicines. These quickly relieve asthma symptoms. They are used as needed and provide short-term relief. Your health care provider will help you create an asthma action plan. An asthma action plan is a written plan for  managing and treating your asthma attacks. It includes a list of your asthma triggers and how they Los be avoided. It also includes information on when medicines should be taken and when their dosage should be changed. An action plan [place] also involve the use of a device called a peak flow meter. A peak  flow meter measures how well the lungs are working. It helps you monitor your condition. HOME CARE INSTRUCTIONS   Take medicines only as directed by your health care provider. Speak with your health care provider if you have questions about how or when to take the medicines.  Use a peak flow meter as directed by your health care provider. Record and keep track of readings.  Understand and use the action plan to help minimize or stop an asthma attack without needing to seek medical care.  Control your home environment in the following ways to help prevent asthma attacks:  Do not smoke. Avoid being exposed to secondhand smoke.  Change your heating and air conditioning filter regularly.  Limit your use of fireplaces and wood stoves.  Get rid of pests (such as roaches and mice) and their droppings.  Throw away plants if you see mold on them.  Clean your floors and dust regularly. Use unscented cleaning products.  Try to have someone else vacuum for you regularly. Stay out of rooms while they are being vacuumed and for a short while afterward. If you vacuum, use a dust mask from a hardware store, a double-layered or microfilter vacuum cleaner bag, or a vacuum cleaner with a HEPA filter.  Replace carpet with wood, tile, or vinyl flooring. Carpet can trap dander and dust.  Use allergy-proof pillows, mattress covers, and box spring covers.  Wash bed sheets and blankets every week in hot water and dry them in a dryer.  Use blankets that are made of polyester or cotton.  Clean bathrooms and kitchens with bleach. If possible, have someone repaint the walls in these rooms with mold-resistant  paint. Keep out of the rooms that are being cleaned and painted.  Wash hands frequently. SEEK MEDICAL CARE IF:   You have wheezing, shortness of breath, or a cough even if taking medicine to prevent attacks.  The colored mucus you cough up (sputum) is thicker than usual.  Your sputum changes from clear or white to yellow, green, gray, or bloody.  You have any problems that Snooks be related to the medicines you are taking (such as a rash, itching, swelling, or trouble breathing).  You are using a reliever medicine more than 2-3 times per week.  Your peak flow is still at 50-79% of your personal best after following your action plan for 1 hour.  You have a fever. SEEK IMMEDIATE MEDICAL CARE IF:   You seem to be getting worse and are unresponsive to treatment during an asthma attack.  You are short of breath even at rest.  You get short of breath when doing very little physical activity.  You have difficulty eating, drinking, or talking due to asthma symptoms.  You develop chest pain.  You develop a fast heartbeat.  You have a bluish color to your lips or fingernails.  You are light-headed, dizzy, or faint.  Your peak flow is less than 50% of your personal best. MAKE SURE YOU:   Understand these instructions.  Will watch your condition.  Will get help right away if you are not doing well or get worse. Document Released: 08/27/2005 Document Revised: 01/11/2014 Document Reviewed: 03/26/2013 [facility] Patient Information 2015 [place], [place]. This information is not intended to replace advice given to you by your health care provider. Make sure you discuss any questions you have with your health care provider.  Asthma Asthma is a condition of the lungs in which the airways tighten and narrow. Asthma can make  it hard to breathe. Asthma cannot be cured, but medicine and lifestyle changes can help control it. Asthma Savitz be started (triggered) by:  Animal skin flakes  (dander).  Dust.  Cockroaches.  Pollen.  Mold.  Smoke.  Cleaning products.  Hair sprays or aerosol sprays.  Paint fumes or strong smells.  Cold air, weather changes, and winds.  Crying or laughing hard.  Stress.  Certain medicines or drugs.  Foods, such as dried fruit, potato chips, and sparkling grape juice.  Infections or conditions (colds, flu).  Exercise.  Certain medical conditions or diseases.  Exercise or tiring activities. HOME CARE   Take medicine as told by your doctor.  Use a peak flow meter as told by your doctor. A peak flow meter is a tool that measures how well the lungs are working.  Record and keep track of the peak flow meter's readings.  Understand and use the asthma action plan. An asthma action plan is a written plan for taking care of your asthma and treating your attacks.  To help prevent asthma attacks:  Do not smoke. Stay away from secondhand smoke.  Change your heating and air conditioning filter often.  Limit your use of fireplaces and wood stoves.  Get rid of pests (such as roaches and mice) and their droppings.  Throw away plants if you see mold on them.  Clean your floors. Dust regularly. Use cleaning products that do not smell.  Have someone vacuum when you are not home. Use a vacuum cleaner with a HEPA filter if possible.  Replace carpet with wood, tile, or vinyl flooring. Carpet can trap animal skin flakes and dust.  Use allergy-proof pillows, mattress covers, and box spring covers.  Wash bed sheets and blankets every week in hot water and dry them in a dryer.  Use blankets that are made of polyester or cotton.  Clean bathrooms and kitchens with bleach. If possible, have someone repaint the walls in these rooms with mold-resistant paint. Keep out of the rooms that are being cleaned and painted.  Wash hands often. GET HELP IF:  You have make a whistling sound when breaking (wheeze), have shortness of breath, or  have a cough even if taking medicine to prevent attacks.  The colored mucus you cough up (sputum) is thicker than usual.  The colored mucus you cough up changes from clear or white to yellow, green, gray, or bloody.  You have problems from the medicine you are taking such as:  A rash.  Itching.  Swelling.  Trouble breathing.  You need reliever medicines more than 2-3 times a week.  Your peak flow measurement is still at 50-79% of your personal best after following the action plan for 1 hour.  You have a fever. GET HELP RIGHT AWAY IF:   You seem to be worse and are not responding to medicine during an asthma attack.  You are short of breath even at rest.  You get short of breath when doing very little activity.  You have trouble eating, drinking, or talking.  You have chest pain.  You have a fast heartbeat.  Your lips or fingernails start to turn blue.  You are light-headed, dizzy, or faint.  Your peak flow is less than 50% of your personal best. MAKE SURE YOU:   Understand these instructions.  Will watch your condition.  Will get help right away if you are not doing well or get worse. Document Released: 02/13/2008 Document Revised: 01/11/2014 Document Reviewed: 03/26/2013 ExitCare  Patient Information 2015 [place], [place]. This information is not intended to replace advice given to you by your health care provider. Make sure you discuss any questions you have with your health care provider.  Blood Glucose Monitoring Monitoring your blood glucose (also know as blood sugar) helps you to manage your diabetes. It also helps you and your health care provider monitor your diabetes and determine how well your treatment plan is working. WHY SHOULD YOU MONITOR YOUR BLOOD GLUCOSE?  It can help you understand how food, exercise, and medicine affect your blood glucose.  It allows you to know what your blood glucose is at any given moment. You can quickly tell if you are  having low blood glucose (hypoglycemia) or high blood glucose (hyperglycemia).  It can help you and your health care provider know how to adjust your medicines.  It can help you understand how to manage an illness or adjust medicine for exercise. WHEN SHOULD YOU TEST? Your health care provider will help you decide how often you should check your blood glucose. This [place] depend on the type of diabetes you have, your diabetes control, or the types of medicines you are taking. Be sure to write down all of your blood glucose readings so that this information can be reviewed with your health care provider. See below for examples of testing times that your health care provider [place] suggest. Type 1 Diabetes  Test 4 times a day if you are in good control, using an insulin pump, or perform multiple daily injections.  If your diabetes is not well controlled or if you are sick, you [place] need to monitor more often.  It is a good idea to also monitor:  Before and after exercise.  Between meals and 2 hours after a meal.  Occasionally between 2:00 a.m. and 3:00 a.m. Type 2 Diabetes  It can vary with each person, but generally, if you are on insulin, test 4 times a day.  If you take medicines by mouth (orally), test 2 times a day.  If you are on a controlled diet, test once a day.  If your diabetes is not well controlled or if you are sick, you [place] need to monitor more often. HOW TO MONITOR YOUR BLOOD GLUCOSE Supplies Needed  Blood glucose meter.  Test strips for your meter. Each meter has its own strips. You must use the strips that go with your own meter.  A pricking needle (lancet).  A device that holds the lancet (lancing device).  A journal or log book to write down your results. Procedure  Wash your hands with soap and water. Alcohol is not preferred.  Prick the side of your finger (not the tip) with the lancet.  Gently milk the finger until a small drop of blood appears.  Follow  the instructions that come with your meter for inserting the test strip, applying blood to the strip, and using your blood glucose meter. Other Areas to Get Blood for Testing Some meters allow you to use other areas of your body (other than your finger) to test your blood. These areas are called alternative sites. The most common alternative sites are:  The forearm.  The thigh.  The back area of the lower leg.  The palm of the hand. The blood flow in these areas is slower. Therefore, the blood glucose values you get [place] be delayed, and the numbers are different from what you would get from your fingers. Do not use alternative sites if  you think you are having hypoglycemia. Your reading will not be accurate. Always use a finger if you are having hypoglycemia. Also, if you cannot feel your lows (hypoglycemia unawareness), always use your fingers for your blood glucose checks. ADDITIONAL TIPS FOR GLUCOSE MONITORING  Do not reuse lancets.  Always carry your supplies with you.  All blood glucose meters have a 24-hour "hotline" number to call if you have questions or need help.  Adjust (calibrate) your blood glucose meter with a control solution after finishing a few boxes of strips. BLOOD GLUCOSE RECORD KEEPING It is a good idea to keep a daily record or log of your blood glucose readings. Most glucose meters, if not all, keep your glucose records stored in the meter. Some meters come with the ability to download your records to your home computer. Keeping a record of your blood glucose readings is especially helpful if you are wanting to look for patterns. Make notes to go along with the blood glucose readings because you might forget what happened at that exact time. Keeping good records helps you and your health care provider to work together to achieve good diabetes management.  Document Released: 08/30/2003 Document Revised: 01/11/2014 Document Reviewed: 01/19/2013 [facility] Patient  Information 2015 [place], [place]. This information is not intended to replace advice given to you by your health care provider. Make sure you discuss any questions you have with your health care provider.

## 2015-03-14 NOTE — Discharge Summary (Signed)
[facility] [place] [facility]   PATIENT NAME: Dominique DroneBrenda Burgess    MR#:  [id number]  DATE OF BIRTH:  03-22-1946  DATE OF ADMISSION:  03/09/2015 ADMITTING PHYSICIAN: Houston SirenVivek J Jamarkis Branam, MD  DATE OF DISCHARGE: 03/14/2015 11:22 AM  PRIMARY CARE PHYSICIAN: Hyman HopesBurns, Harriett P, MD    ADMISSION DIAGNOSIS:  Respiratory distress [R06.00] SOB (shortness of breath) [R06.02]  DISCHARGE DIAGNOSIS:  Active Problems:   COPD exacerbation   SECONDARY DIAGNOSIS:   Past Medical History  Diagnosis Date  . COPD (chronic obstructive pulmonary disease)   . Diabetes mellitus without complication   . Hypertension   . Hyperlipidemia   . Dementia   . CHF (congestive heart failure)     HOSPITAL COURSE:   69 year old female with past medical history of COPD with ongoing tobacco abuse, type 2 diabetes without, patient, hypertension, hyperlipidemia, mild dementia, history of chronic diastolic CHF, suspected sleep apnea who presented to the hospital with shortness of breath and noted to be in COPD exacerbation.  1. Acute respiratory failure - likely multifactorial. Combination of COPD exacerbation/CHF/obstructive sleep apnea. -Patient was started on around-the-clock nebulizer treatments, IV steroids, also given empiric antibiotics with Levaquin and has clinically improved. -She was also given intermittent Lasix for her diastolic CHF and has improved from that standpoint too. -Patient is suspected to have sleep apnea although while in the hospital she could not tolerate CPAP at all, she was even given anxiolytics prior to placing the mask and she couldn't tolerate it. -Since she has clinically improved with much improved wheezing and bronchospasm she is being discharged home on oral prednisone taper oral antibiotics and home oxygen. -Patient is strongly advised to get a outpatient sleep study and attempt to use CPAP as this would be beneficial for her long-term  2. Acute bronchitis -  this is much improved and patient was discharged on oral Levaquin. Patient's sputum cultures were negative.  3. Acute diastolic congestive heart failure-improved with diuresis. -Continue Lasix -Echocardiogram done showing normal ejection fraction of 60%.  4. COPD exacerbation-this was likely secondary to acute bronchitis. Patient was treated with aggressive IV steroids, around-the-clock nebs, maintenance of her inhalers, and empiric antibiotics and has clinically improved. -Presently patient is being discharged on oral prednisone taper, oral Levaquin, Symbicort, Spiriva, and home oxygen. Patient was strongly advised to quit smoking.  5. Suspected obstructive sleep apnea. Patient needs sleep study as outpatient.  -Could not tolerate the facial CPAP mask while in the hospital.  6. Type 2 diabetes without complication-patient's hemoglobin A1c was 7. -Her metformin dose was advanced. She was also started on insulin and is being discharged on some Lantus and NovoLog with meals.  Patient was seen by physical therapy and thought she would benefit from home health services therefore she is being discharged with home health nursing and physical therapy services.  DISCHARGE CONDITIONS:   Stable  CONSULTS OBTAINED:     DRUG ALLERGIES:   Allergies  Allergen Reactions  . Penicillins Hives and Shortness Of Breath    DISCHARGE MEDICATIONS:   Discharge Medication List as of 03/14/2015 11:06 AM    START taking these medications   Details  insulin aspart (NOVOLOG) 100 UNIT/ML injection Inject 3 Units into the skin 3 (three) times daily with meals., Starting 03/14/2015, Until Discontinued, Print    insulin glargine (LANTUS) 100 UNIT/ML injection Inject 0.1 mLs (10 Units total) into the skin at bedtime., Starting 03/14/2015, Until Discontinued, Print    levofloxacin (LEVAQUIN) 750 MG tablet Take  1 tablet (750 mg total) by mouth daily., Starting 03/14/2015, Until Discontinued, Print    predniSONE  (DELTASONE) 10 MG tablet Take 1 tablet (10 mg total) by mouth daily with breakfast., Starting 03/14/2015, Until Discontinued, Print    tiotropium (SPIRIVA) 18 MCG inhalation capsule Place 1 capsule (18 mcg total) into inhaler and inhale daily., Starting 03/14/2015, Until Discontinued, Print      CONTINUE these medications which have CHANGED   Details  metFORMIN (GLUCOPHAGE) 500 MG tablet Take 2 tablets (1,000 mg total) by mouth 2 (two) times daily with a meal., Starting 03/14/2015, Until Discontinued, Print      CONTINUE these medications which have NOT CHANGED   Details  albuterol (PROVENTIL HFA;VENTOLIN HFA) 108 (90 BASE) MCG/ACT inhaler Inhale 2 puffs into the lungs every 6 (six) hours as needed for wheezing or shortness of breath., Until Discontinued, Historical Med    amLODipine (NORVASC) 10 MG tablet Take 10 mg by mouth daily., Until Discontinued, Historical Med    budesonide-formoterol (SYMBICORT) 160-4.5 MCG/ACT inhaler Inhale 2 puffs into the lungs 2 (two) times daily., Until Discontinued, Historical Med    furosemide (LASIX) 40 MG tablet Take 40 mg by mouth daily., Until Discontinued, Historical Med    ipratropium (ATROVENT HFA) 17 MCG/ACT inhaler Inhale 2 puffs into the lungs 4 (four) times daily., Until Discontinued, Historical Med    lisinopril (PRINIVIL,ZESTRIL) 20 MG tablet Take 20 mg by mouth daily., Until Discontinued, Historical Med    memantine (NAMENDA XR) 7 MG CP24 24 hr capsule Take 7 mg by mouth daily., Until Discontinued, Historical Med    naproxen sodium (ANAPROX) 220 MG tablet Take 220 mg by mouth daily as needed (for pain)., Until Discontinued, Historical Med    omeprazole (PRILOSEC) 20 MG capsule Take 20 mg by mouth 2 (two) times daily., Until Discontinued, Historical Med    potassium chloride (K-DUR) 10 MEQ tablet Take 10 mEq by mouth 2 (two) times daily., Until Discontinued, Historical Med    pravastatin (PRAVACHOL) 40 MG tablet Take 40 mg by mouth at bedtime.,  Until Discontinued, Historical Med    traZODone (DESYREL) 100 MG tablet Take 100 mg by mouth at bedtime., Until Discontinued, Historical Med         DISCHARGE INSTRUCTIONS:   DIET:  Cardiac diet and Diabetic diet  DISCHARGE CONDITION:  Stable  ACTIVITY:  Activity as tolerated  OXYGEN:  Home Oxygen: Yes.     Oxygen Delivery: 2 liters/min via Patient connected to nasal cannula oxygen  DISCHARGE LOCATION:  Home with home health physical therapy and nursing services   If you experience worsening of your admission symptoms, develop shortness of breath, life threatening emergency, suicidal or homicidal thoughts you must seek medical attention immediately by calling 911 or calling your MD immediately  if symptoms less severe.  You Must read complete instructions/literature along with all the possible adverse reactions/side effects for all the Medicines you take and that have been prescribed to you. Take any new Medicines after you have completely understood and accpet all the possible adverse reactions/side effects.   Please note  You were cared for by a hospitalist during your hospital stay. If you have any questions about your discharge medications or the care you received while you were in the hospital after you are discharged, you can call the unit and asked to speak with the hospitalist on call if the hospitalist that took care of you is not available. Once you are discharged, your primary care physician will handle any  further medical issues. Please note that NO REFILLS for any discharge medications will be authorized once you are discharged, as it is imperative that you return to your primary care physician (or establish a relationship with a primary care physician if you do not have one) for your aftercare needs so that they can reassess your need for medications and monitor your lab values.     Today   Clinically patient feels better. Shortness of breath improved. No  wheezing/bronchospasm today. Still could not tolerate the CPAP overnight.  VITAL SIGNS:  Blood pressure 165/60, pulse 96, temperature 98.4 F (36.9 C), temperature source Oral, resp. rate 22, height  (1.575 m), weight 96.248 kg (212 lb 3 oz), SpO2 90 %.  I/O:   Intake/Output Summary (Last 24 hours) at 03/14/15 1232 Last data filed at 03/14/15 0900  Gross per 24 hour  Intake    720 ml  Output   2000 ml  Net  -1280 ml    PHYSICAL EXAMINATION:  GENERAL:  69 y.o.-year-old obese patient lying in the bed with no acute distress.  EYES: Pupils equal, round, reactive to light and accommodation. No scleral icterus. Extraocular muscles intact.  HEENT: Head atraumatic, normocephalic. Oropharynx and nasopharynx clear.  NECK:  Supple, no jugular venous distention. No thyroid enlargement, no tenderness.  LUNGS: Normal breath sounds bilaterally, no wheezing, rales,rhonchi. No use of accessory muscles of respiration.  CARDIOVASCULAR: S1, S2 normal. No murmurs, rubs, or gallops.  ABDOMEN: Soft, non-tender, non-distended. Bowel sounds present. No organomegaly or mass.  EXTREMITIES: Trace pedal edema bilaterally, no cyanosis, or clubbing.  NEUROLOGIC: Cranial nerves II through XII are intact. No focal motor or sensory defecits b/l.  PSYCHIATRIC: The patient is alert and oriented x 3. Good affect.  SKIN: No obvious rash, lesion, or ulcer.   DATA REVIEW:   CBC  Recent Labs Lab 03/12/15 0413  WBC 8.2  HGB 10.9*  HCT 34.5*  PLT 267    Chemistries   Recent Labs Lab 03/12/15 0413  NA 133*  K 5.1  CL 94*  CO2 29  GLUCOSE 245*  BUN 35*  CREATININE 1.08*  CALCIUM 7.4*    Cardiac Enzymes  Recent Labs Lab 03/09/15 1309  TROPONINI <0.03    Microbiology Results  Results for orders placed or performed during the hospital encounter of 03/09/15  Culture, expectorated sputum-assessment     Status: None   Collection Time: 03/10/15  1:46 PM  Result Value Ref Range Status    Specimen Description SPUTUM  Final   Special Requests NONE  Final   Sputum evaluation   Final    Sputum specimen not acceptable for testing.  Please recollect.   CTJ TO JANE HODGE AT 1950 03/10/15    Report Status 03/10/2015 FINAL  Final    RADIOLOGY:  No results found.    Management plans discussed with the patient, family and they are in agreement.  CODE STATUS:     Code Status Orders        Start     Ordered   03/09/15 1747  Full code   Continuous     03/09/15 1746      TOTAL TIME TAKING CARE OF THIS PATIENT: 40 minutes.    Houston Siren M.D on 03/14/2015 at 12:32 PM  Between 7am to 6pm - Pager - [phone]  After 6pm go to [email or web address] - password [email or web address]  [place] [facility]  Office  [phone]  CC: Primary care physician; Hyman Hopes, MD

## 2015-03-14 NOTE — Progress Notes (Addendum)
No oxygen : at rest 87%  03/14/2015 0845  with exertion 84% 03/14/2015 0850  On 2 L nasal canula 94% 03/14/2015 0856

## 2015-03-14 NOTE — Progress Notes (Signed)
Pt attempted to wear cpap.  However after wear for about 10 minutes pt called RN to the room to remove cpap.  Cannot tolerate at this time. Complains that it makes her chest hurt and she feels like she can't breath.  Will continue to monitor.  Orson Apeanielle Marykate Heuberger, RN

## 2015-03-14 NOTE — Plan of Care (Addendum)
Problem: Discharge Progression Outcomes Goal: Other Discharge Outcomes/Goals Outcome: Progressing Plan of care progress to goal for: 1. Pain-pt c/o pain in her abdomen, prn tylenol given with improvement 2. Hemodynamically-VSS, pt refused cpap and desated during the night, when woken up pt goes back to 94-96% 3. Complications-refused cpap 4. Diet-pt tolerating diet this shift 5. [facility], daughter is at bedside

## 2015-03-14 NOTE — Progress Notes (Signed)
Discharge order to home with home health. Discharge instructions reviewed with patient and daughter.  Patient and daughter verbalized understanding.  COPD  Gold and living with Diabetes books reveiwed with patient and daughter.  Patient's daughter will be giving the patient her insulin amd checking her CBG.  Pt discharged

## 2015-03-25 LAB — EXPECTORATED SPUTUM ASSESSMENT W REFEX TO RESP CULTURE

## 2015-03-25 LAB — EXPECTORATED SPUTUM ASSESSMENT W GRAM STAIN, RFLX TO RESP C

## 2015-05-12 DEATH — deceased

## 2015-08-20 IMAGING — CR DG CHEST 2V
1 series · 2 of 2 positions shown · non-contrast
Comparison: 11/08/2013

CLINICAL DATA: Shortness of breath.

EXAM:
CHEST  2 VIEW

[Series 1: dxr chest pa (or ap) and lateral · 0.14mm/px · 2 of 2 slices shown]
[im 1/2]
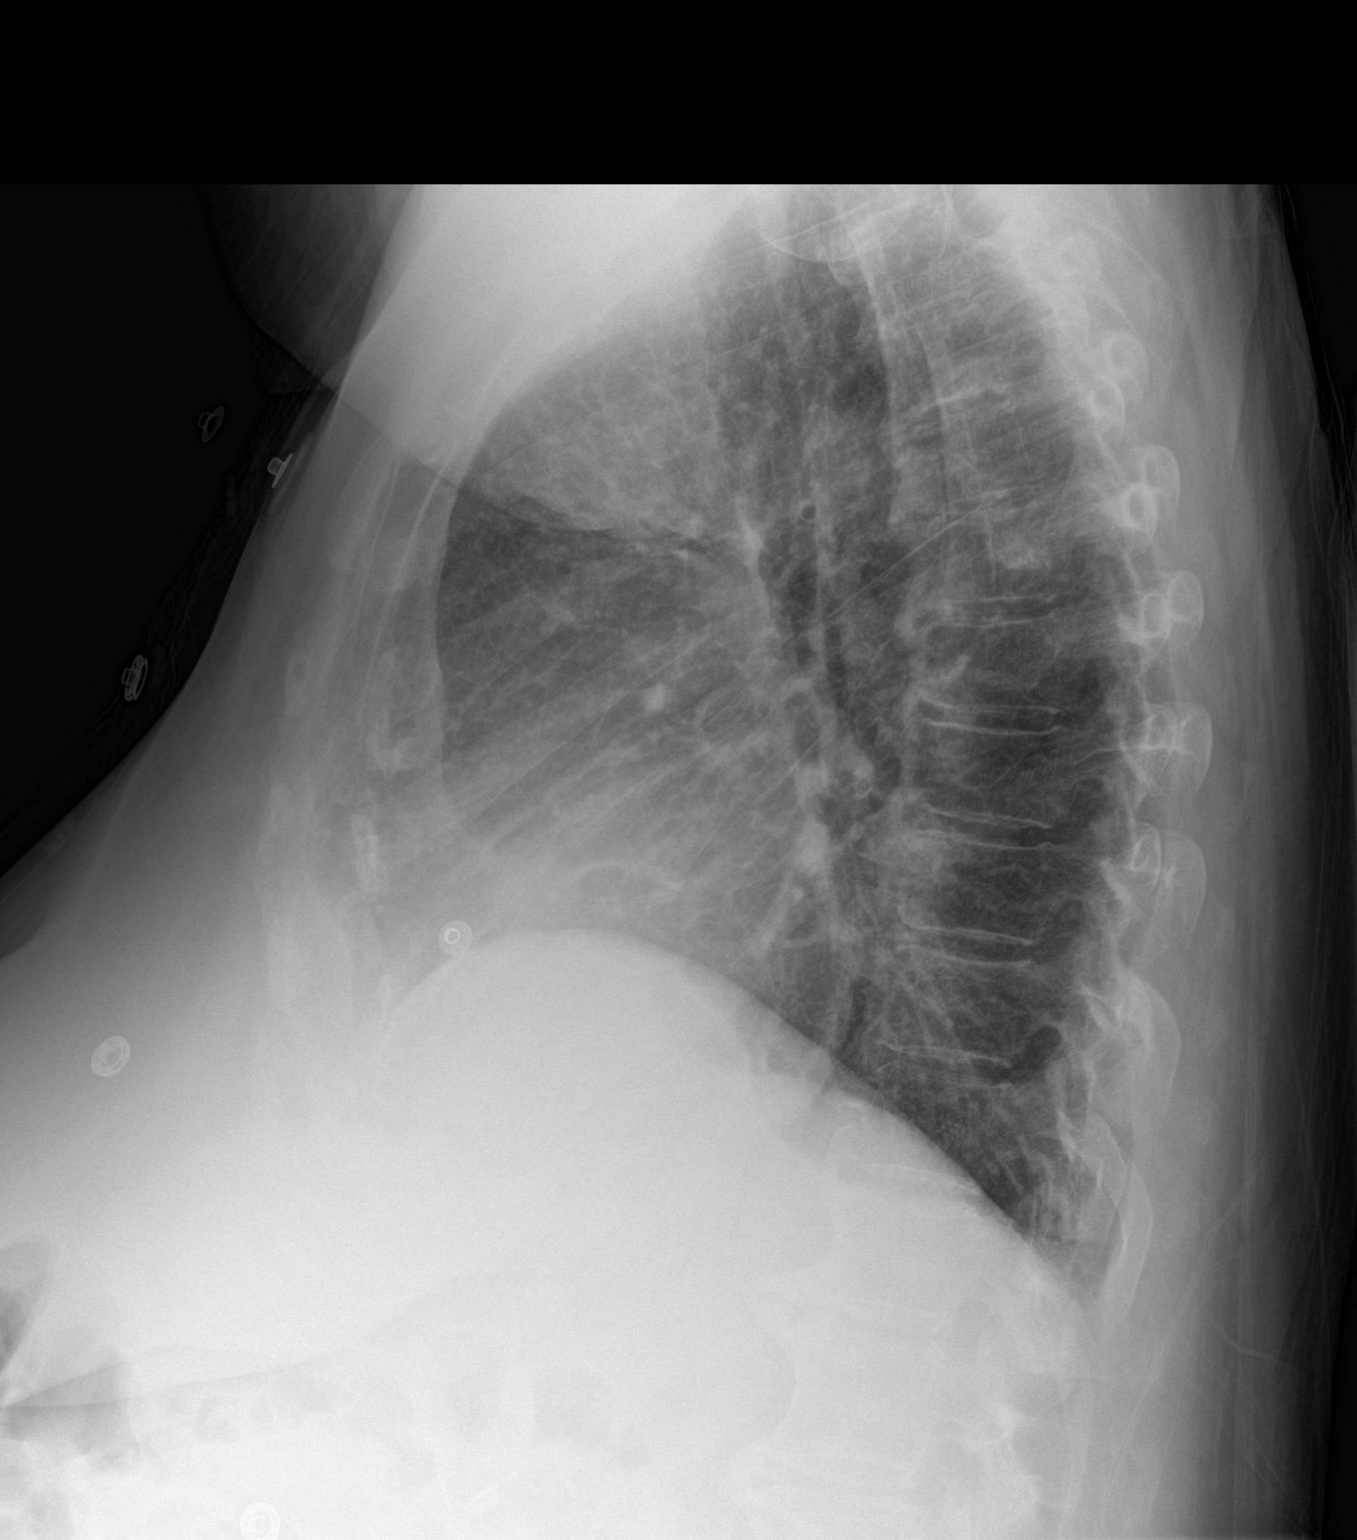
[im 2/2]
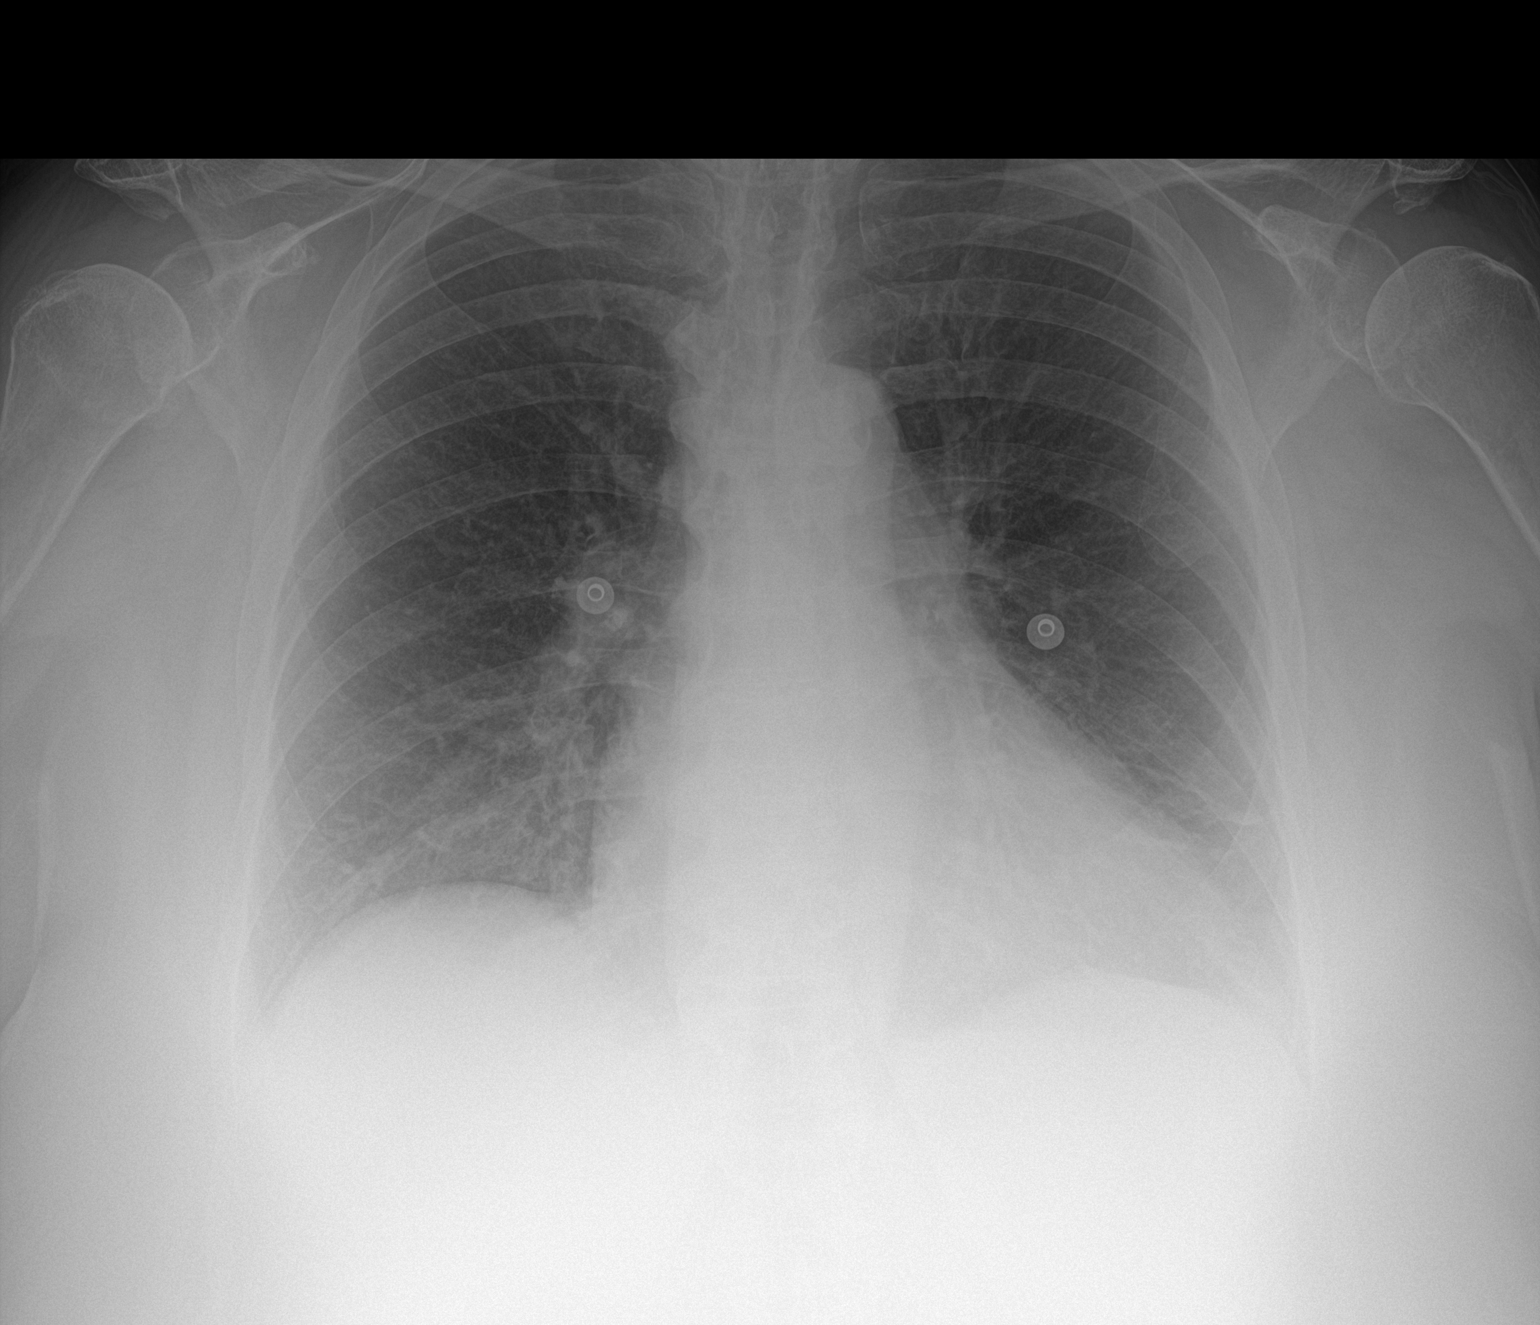

[2 of 2 positions shown; findings below may reference images not displayed]

FINDINGS: Mild cardiomegaly. No confluent opacities or effusions. No overt
edema. No acute bony abnormality.
IMPRESSION: Mild cardiomegaly.  No active disease.
